# Patient Record
Sex: Female | Born: 1970
Health system: Southern US, Community
[De-identification: ages and names within clinical notes are randomized; demographics above are authoritative.]

## PROBLEM LIST (undated history)

## (undated) DIAGNOSIS — IMO0001 Reserved for inherently not codable concepts without codable children: Secondary | ICD-10-CM

## (undated) DIAGNOSIS — R87619 Unspecified abnormal cytological findings in specimens from cervix uteri: Secondary | ICD-10-CM

## (undated) DIAGNOSIS — J302 Other seasonal allergic rhinitis: Secondary | ICD-10-CM

## (undated) DIAGNOSIS — G43909 Migraine, unspecified, not intractable, without status migrainosus: Secondary | ICD-10-CM

## (undated) DIAGNOSIS — Z1509 Genetic susceptibility to other malignant neoplasm: Secondary | ICD-10-CM

## (undated) DIAGNOSIS — Z9889 Other specified postprocedural states: Secondary | ICD-10-CM

## (undated) DIAGNOSIS — Z9109 Other allergy status, other than to drugs and biological substances: Secondary | ICD-10-CM

## (undated) DIAGNOSIS — M8580 Other specified disorders of bone density and structure, unspecified site: Secondary | ICD-10-CM

## (undated) DIAGNOSIS — F988 Other specified behavioral and emotional disorders with onset usually occurring in childhood and adolescence: Secondary | ICD-10-CM

## (undated) DIAGNOSIS — Z1501 Genetic susceptibility to malignant neoplasm of breast: Secondary | ICD-10-CM

## (undated) DIAGNOSIS — J45909 Unspecified asthma, uncomplicated: Secondary | ICD-10-CM

## (undated) DIAGNOSIS — F329 Major depressive disorder, single episode, unspecified: Secondary | ICD-10-CM

## (undated) DIAGNOSIS — L709 Acne, unspecified: Secondary | ICD-10-CM

## (undated) DIAGNOSIS — IMO0002 Reserved for concepts with insufficient information to code with codable children: Secondary | ICD-10-CM

## (undated) DIAGNOSIS — T7840XA Allergy, unspecified, initial encounter: Secondary | ICD-10-CM

## (undated) DIAGNOSIS — R112 Nausea with vomiting, unspecified: Secondary | ICD-10-CM

## (undated) HISTORY — DX: Major depressive disorder, single episode, unspecified: F32.9

## (undated) HISTORY — DX: Genetic susceptibility to other malignant neoplasm: Z15.09

## (undated) HISTORY — PX: INTRAUTERINE DEVICE (IUD) INSERTION: SHX5877

## (undated) HISTORY — PX: NASAL SINUS SURGERY: SHX719

## (undated) HISTORY — DX: Allergy, unspecified, initial encounter: T78.40XA

## (undated) HISTORY — DX: Reserved for inherently not codable concepts without codable children: IMO0001

## (undated) HISTORY — DX: Migraine, unspecified, not intractable, without status migrainosus: G43.909

## (undated) HISTORY — DX: Unspecified abnormal cytological findings in specimens from cervix uteri: R87.619

## (undated) HISTORY — DX: Acne, unspecified: L70.9

## (undated) HISTORY — DX: Genetic susceptibility to malignant neoplasm of breast: Z15.01

## (undated) HISTORY — DX: Reserved for concepts with insufficient information to code with codable children: IMO0002

## (undated) HISTORY — DX: Other specified disorders of bone density and structure, unspecified site: M85.80

## (undated) HISTORY — PX: COSMETIC SURGERY: SHX468

---

## 1999-01-18 ENCOUNTER — Encounter (INDEPENDENT_AMBULATORY_CARE_PROVIDER_SITE_OTHER): Payer: Self-pay | Admitting: Specialist

## 1999-01-18 ENCOUNTER — Other Ambulatory Visit: Admission: RE | Admit: 1999-01-18 | Discharge: 1999-01-18 | Payer: Self-pay | Admitting: Obstetrics and Gynecology

## 2000-01-28 ENCOUNTER — Encounter: Payer: Self-pay | Admitting: Obstetrics and Gynecology

## 2000-01-28 ENCOUNTER — Ambulatory Visit (HOSPITAL_COMMUNITY): Admission: RE | Admit: 2000-01-28 | Discharge: 2000-01-28 | Payer: Self-pay | Admitting: Obstetrics and Gynecology

## 2000-02-05 ENCOUNTER — Other Ambulatory Visit: Admission: RE | Admit: 2000-02-05 | Discharge: 2000-02-05 | Payer: Self-pay | Admitting: Obstetrics and Gynecology

## 2000-06-30 ENCOUNTER — Encounter (INDEPENDENT_AMBULATORY_CARE_PROVIDER_SITE_OTHER): Payer: Self-pay | Admitting: Specialist

## 2000-06-30 ENCOUNTER — Inpatient Hospital Stay (HOSPITAL_COMMUNITY): Admission: AD | Admit: 2000-06-30 | Discharge: 2000-07-04 | Payer: Self-pay | Admitting: Obstetrics and Gynecology

## 2000-07-06 ENCOUNTER — Encounter: Admission: RE | Admit: 2000-07-06 | Discharge: 2000-07-31 | Payer: Self-pay | Admitting: Obstetrics and Gynecology

## 2000-07-22 HISTORY — PX: BREAST ENHANCEMENT SURGERY: SHX7

## 2001-01-19 HISTORY — PX: AUGMENTATION MAMMAPLASTY: SUR837

## 2001-09-02 ENCOUNTER — Other Ambulatory Visit: Admission: RE | Admit: 2001-09-02 | Discharge: 2001-09-02 | Payer: Self-pay | Admitting: Obstetrics and Gynecology

## 2002-09-13 ENCOUNTER — Other Ambulatory Visit: Admission: RE | Admit: 2002-09-13 | Discharge: 2002-09-13 | Payer: Self-pay | Admitting: *Deleted

## 2003-09-28 ENCOUNTER — Other Ambulatory Visit: Admission: RE | Admit: 2003-09-28 | Discharge: 2003-09-28 | Payer: Self-pay | Admitting: Obstetrics and Gynecology

## 2004-05-25 ENCOUNTER — Ambulatory Visit: Payer: Self-pay | Admitting: Family Medicine

## 2004-07-22 DIAGNOSIS — F32A Depression, unspecified: Secondary | ICD-10-CM

## 2004-07-22 DIAGNOSIS — M8580 Other specified disorders of bone density and structure, unspecified site: Secondary | ICD-10-CM

## 2004-07-22 HISTORY — DX: Depression, unspecified: F32.A

## 2004-07-22 HISTORY — DX: Other specified disorders of bone density and structure, unspecified site: M85.80

## 2004-08-29 ENCOUNTER — Other Ambulatory Visit: Admission: RE | Admit: 2004-08-29 | Discharge: 2004-08-29 | Payer: Self-pay | Admitting: Obstetrics and Gynecology

## 2005-11-01 ENCOUNTER — Other Ambulatory Visit: Admission: RE | Admit: 2005-11-01 | Discharge: 2005-11-01 | Payer: Self-pay | Admitting: Obstetrics and Gynecology

## 2005-11-18 ENCOUNTER — Ambulatory Visit: Payer: Self-pay | Admitting: Family Medicine

## 2006-06-16 ENCOUNTER — Ambulatory Visit: Payer: Self-pay | Admitting: Family Medicine

## 2006-07-30 ENCOUNTER — Emergency Department (HOSPITAL_COMMUNITY): Admission: EM | Admit: 2006-07-30 | Discharge: 2006-07-30 | Payer: Self-pay | Admitting: Emergency Medicine

## 2006-07-31 ENCOUNTER — Other Ambulatory Visit: Admission: RE | Admit: 2006-07-31 | Discharge: 2006-07-31 | Payer: Self-pay | Admitting: Obstetrics and Gynecology

## 2006-10-01 ENCOUNTER — Ambulatory Visit: Payer: Self-pay | Admitting: Family Medicine

## 2007-06-03 ENCOUNTER — Encounter (INDEPENDENT_AMBULATORY_CARE_PROVIDER_SITE_OTHER): Payer: Self-pay | Admitting: Internal Medicine

## 2007-08-06 ENCOUNTER — Other Ambulatory Visit: Admission: RE | Admit: 2007-08-06 | Discharge: 2007-08-06 | Payer: Self-pay | Admitting: Obstetrics & Gynecology

## 2008-02-23 ENCOUNTER — Encounter (INDEPENDENT_AMBULATORY_CARE_PROVIDER_SITE_OTHER): Payer: Self-pay | Admitting: Internal Medicine

## 2008-09-12 ENCOUNTER — Other Ambulatory Visit: Admission: RE | Admit: 2008-09-12 | Discharge: 2008-09-12 | Payer: Self-pay | Admitting: Obstetrics & Gynecology

## 2008-11-24 ENCOUNTER — Other Ambulatory Visit: Admission: RE | Admit: 2008-11-24 | Discharge: 2008-11-24 | Payer: Self-pay | Admitting: Obstetrics & Gynecology

## 2009-02-22 ENCOUNTER — Encounter (INDEPENDENT_AMBULATORY_CARE_PROVIDER_SITE_OTHER): Payer: Self-pay | Admitting: Internal Medicine

## 2010-12-07 NOTE — Op Note (Signed)
[facility]  Patient:    Susan West, Susan West                     MRN: [id number] Proc. Date: 06/30/00 Adm. Date:  [id number] Attending:  Madelyn Flavors                           Operative Report  PREOPERATIVE DIAGNOSES:       1. Intrauterine pregnancy at 33-1/2 weeks.                               2. Severe HELLP syndrome with nonreducible                                  cervix.  POSTOPERATIVE DIAGNOSES:      1. Intrauterine pregnancy at 33-1/2 weeks.                               2. Severe HELLP syndrome with nonreducible                                  cervix.  OPERATION:                    Primary low cervical transverse cesarean                               section.  SURGEON:                      Beather Arbour. Thomasena Edis, M.D., Ph.D.  ANESTHESIA:                   Spinal.  FLUIDS:                       2700 cc of crystalloid plus 1 g of cefotetan IV.  URINE OUTPUT:                 100 cc.  ESTIMATED BLOOD LOSS:         800 cc.  COMPLICATIONS:                None.  DESCRIPTION OF OPERATION:     The patient was brought to the operating room and identified on the operating room table.  After induction of adequate spinal anesthesia, the patient was placed in the left uterine displacement position, prepped and draped in the usual sterile fashion.  A Foley catheter was placed.  A Pfannenstiel incision was made and carried down to the fascia. The fascia was scored in the midline and extended bilateral using Mayo scissors.  It was then separated from the underlying muscles.  The muscles were separated in the midline down to the symphysis.  The peritoneum was entered sharply and carefully, taking care to avoid bowel or other abdominal contents. The peritoneal incision was extended superiorly then inferiorly down to the bladder edge.  A bladder blade was placed and a bladder flap was developed.  The uterus was scored in the lower uterine segment and  the incision was extended in a curvilinear fashion with bandage  scissors.  Great care was taken to avoid large sinuses on the left-most portion of the uterus. The fetus had been vertex in the MAU, but was noted to be almost transverse upon entry into the uterine cavity when membranes were ruptured.  The vertex was delivered without difficulty after it was rotated from the transverse to the vertex presentation.  The vertex was delivered onto the operative field without difficulty.  The baby was bulb suctioned and the remainder of the infants body was delivered onto the operative field.  The cord was doubly clamped and cut.  The baby was handed to the awaiting neonatal resuscitation team.  Cord pH was sent and was found to be 7.25.  Cord bloods were collected. The placenta was allowed to delivers spontaneously and sent to pathology for examination due to severe HELLP syndrome at 33-1/2 weeks.  The uterus was wiped clean of remaining products of conception and the superior aspect wrapped with a wet lap pack.  The bladder blade was placed.  Ring clamps were placed on the uterine incision.  The uterus was closed using a suture of 0 Monocryl in a simple running interlocking fashion.  An addition suture of 0 Monocryl was placed in an imbricating fashion.  There were noted to be two bleeding edges at the uterine incision.  Excellent hemostasis was achieved using figure-of-eight sutures of 0 Monocryl.  The uterus, tubes and ovaries appeared grossly normal.  The uterus was placed back into the abdominal cavity and the incision was irrigated copiously with warm lactated Ringers.  The uterine incision was again examined and examined numerous times and noted to be extremely hemostatic.  The bladder flap was then closed using simple running interlocking suture of 2-0 Monocryl.  Again, excellent hemostasis was noted at every layer.  Austina clamps were placed on the peritoneum and the peritoneum was closed  using a simple running suture of 3-0 Monocryl.  This was after the gutters were irrigated free of clots.  The subfascial tissue was irrigated and excellent hemostasis of the subfascial area was achieved using cautery.  The muscles were tacked together over the midline using interrupted sutures of 0 Monocryl.  After assuring excellent subfascial hemostasis, the fascia was closed using simple running suture of 0 PDS.  The left most angle of the incision was run to the right most angle of the incision and tied.  The subcutaneous tissue was irrigated copiously with warm lactated Ringers and excellent hemostasis was achieved using cautery.  The skin was closed with staples and Steri-Strips were applied.  The patient tolerated the procedure well without apparent complications.  She was transferred to the recovery room in stable condition after sponge, needle and instrument counts were correct. The baby was noted to be a 3 pound 13 ounce female with Apgars 8 and 8.  Cord pH was 7.25.  The baby went to the neonatal intensive care nursery.  The patient was taken to the recovery room, where she was placed on magnesium sulfate for seizure prophylaxis.  Laboratory studies will be followed very carefully. DD:  06/30/00 TD:  07/01/00 Job: [id number]

## 2010-12-07 NOTE — Discharge Summary (Signed)
[facility]  Patient:    Susan West, Susan West                     MRN: [id number] Adm. Date:  [id number] Disc. Date: [id number] Attending:  Madelyn Flavors Dictator:   Danie Chandler, R.N.                           Discharge Summary  ADMITTING DIAGNOSES:          1. Intrauterine pregnancy at 37 1/[redacted] weeks                                  gestation.                               2. Severe HELLP syndrome with nonreducible                                  cervix.  DISCHARGE DIAGNOSES:          1. Intrauterine pregnancy at 83 1/[redacted] weeks                                  gestation.                               2. Severe HELLP syndrome with nonreducible                                  cervix.  PROCEDURE:                    On June 30, 2000 primary low cervical transverse cesarean section.  REASON FOR ADMISSION:         The patient is a 40 year old gravida 3, para 1 married white female at 46 1/[redacted] weeks gestation who came to the maternity admissions unit complaining of contractions.  She received two doses of subcu terbutaline without effect.  At the time of admission she was complaining of some slight nausea.  The decision was made to check a PIH profile.  PIH revealed 30 mg of protein, creatinine 1.8, platelets 174,000, LDH 1117, SGOT 238, SGPT 313, uric acid 9.0, hemoglobin 12.0.  Laboratories were rechecked for accuracy and these results were accurate.  The patient had felt bad for three days.  The decision was made to proceed with primary cesarean section due to severe elevation of LFTs consistent with HELLP syndrome.  HOSPITAL COURSE:              The patient was taken to the operating room and underwent the above named procedure without complication.  This was productive of a viable female infant with Apgars of 8 at one minute and 8 at five minutes and an arterial cord pH of 7.25.  Postoperatively the patient was on magnesium sulfate.  On  postoperative day #1 her O2 saturations were 95-98%.  Her urine output was 100-400 cc/hour.  Her deep tendon reflexes were within normal limits.  She had trace edema.  Her PT was 13.9, PTT 30,  fibrinogen 292, creatinine 1.7, uric acid 8.4, LDH 430, SGOT 226, SGPT 272.  The patient was continued on magnesium sulfate and on postoperative day #2 the patients O2 saturations were 99%.  Urine output was 150-175 cc/hour.  Fibrinogen was 321, PTT 42, PT 13.7, creatinine 1.8, SGOT 107, SGPT 185, hemoglobin 10.7, platelets 188,000.  The patient was in stable condition and was transferred to a regular room.  On postoperative day #3 the patient was without complaint. Deep tendon reflexes were 1+ with no clonus.  She was discharged home on postoperative day #4.  She was without PIH signs or symptoms.  Deep tendon reflexes were 2+ with no clonus.  She had 2+ edema.  Her LDH was 192, SGOT 46, SGPT 111, platelets were within normal limits.  CONDITION ON DISCHARGE:       Stable.  DIET:                         Regular, as tolerated.  ACTIVITY:                     No heavy lifting, driving, vaginal entry.  FOLLOW-UP:                    She is to follow up for laboratory work in the morning and on Monday in the office.  She is to call for temperature greater than 100 degrees, persistent nausea or vomiting, heavy vaginal bleeding, and/or redness or drainage from the incision site as well as any PIH signs or symptoms.  DISCHARGE MEDICATIONS:        1. Prenatal vitamin one p.o. q.d.                               2. Tylox as directed by M.D.                               3. Keflex as directed by M.D. for persistent                                  sinus drainage consistent with sinusitis. DD:  07/04/00 TD:  07/04/00 Job: [id number]

## 2011-07-23 HISTORY — PX: OOPHORECTOMY: SHX86

## 2011-10-21 ENCOUNTER — Other Ambulatory Visit: Payer: Self-pay | Admitting: Allergy and Immunology

## 2011-10-21 DIAGNOSIS — J329 Chronic sinusitis, unspecified: Secondary | ICD-10-CM

## 2011-10-25 ENCOUNTER — Other Ambulatory Visit: Payer: Self-pay

## 2011-11-01 ENCOUNTER — Ambulatory Visit
Admission: RE | Admit: 2011-11-01 | Discharge: 2011-11-01 | Disposition: A | Discharge status: Home / Self Care | Payer: BC Managed Care – PPO | Source: Ambulatory Visit | Attending: Allergy and Immunology | Admitting: Allergy and Immunology

## 2011-11-01 DIAGNOSIS — J329 Chronic sinusitis, unspecified: Secondary | ICD-10-CM

## 2012-03-11 ENCOUNTER — Other Ambulatory Visit: Payer: Self-pay | Admitting: Obstetrics & Gynecology

## 2012-03-11 DIAGNOSIS — Z1231 Encounter for screening mammogram for malignant neoplasm of breast: Secondary | ICD-10-CM

## 2012-03-31 ENCOUNTER — Encounter: Payer: Self-pay | Admitting: Gynecologic Oncology

## 2012-04-03 ENCOUNTER — Ambulatory Visit: Payer: BC Managed Care – PPO | Attending: Gynecology | Admitting: Gynecology

## 2012-04-03 ENCOUNTER — Encounter: Payer: Self-pay | Admitting: Gynecology

## 2012-04-03 VITALS — BP 100/58 | HR 70 | Temp 98.4°F | Resp 20 | Ht 62.64 in | Wt 120.9 lb

## 2012-04-03 DIAGNOSIS — N9489 Other specified conditions associated with female genital organs and menstrual cycle: Secondary | ICD-10-CM | POA: Insufficient documentation

## 2012-04-03 DIAGNOSIS — N83209 Unspecified ovarian cyst, unspecified side: Secondary | ICD-10-CM

## 2012-04-03 DIAGNOSIS — R1909 Other intra-abdominal and pelvic swelling, mass and lump: Secondary | ICD-10-CM | POA: Insufficient documentation

## 2012-04-03 DIAGNOSIS — R1900 Intra-abdominal and pelvic swelling, mass and lump, unspecified site: Secondary | ICD-10-CM

## 2012-04-03 NOTE — Patient Instructions (Signed)
We will contact Dr. Rondel Baton office to coordinate surgery.

## 2012-04-03 NOTE — Progress Notes (Signed)
Consult Note: Gyn-Onc   Gayathri N Vandam 41 y.o. female  Chief Complaint  Patient presents with  . Pelvic mass    New Consult        HPI: 41-year-old white female seen in consultation request of Dr. Suzanne Miller regarding a newly diagnosed complex pelvic mass. Patient indicates that she saw Dr. Miller for evaluation of her IUD which needed to be changed. In the course of evaluation the mass was found. Ultrasound describes the mass is a right adnexal mass measuring 12 x 9 x 10.2 x 10.8 cm. There cystic areas as well as solid areas with calcifications. Arterial blood flow was seen within the mass. The left ovary measures 4 x 2.7 x 2.7 cm. Uterus appears normal. Patient does have some liver cysts. CA 125 is 14.9 units per mL.  The patient reports some vague abdominal pressure and slight change in bowel habits.  She has a past history of a cesarean section but no other abdominal surgery. She's currently using a Mirena IUD.  Review of Systems:10 point review of systems is negative as noted above.   Vitals: Blood pressure 100/58, pulse 70, temperature 98.4 F (36.9 C), temperature source Oral, resp. rate 20, height 5' 2.64" (1.591 m), weight 120 lb 14.4 oz (54.84 kg).  Physical Exam: General : The patient is a healthy woman in no acute distress.  HEENT: normocephalic, extraoccular movements normal; neck is supple without thyromegally  Lynphnodes: Supraclavicular and inguinal nodes not enlarged  Abdomen: Soft, non-tender, no ascites, no organomegally, no masses, no hernias  Pelvic:  EGBUS: Normal female  Vagina: Normal, no lesions  Urethra and Bladder: Normal, non-tender  Cervix: Normal Uterus: Normal size and pushed posteriorly by a cystic mass in the anterior pelvis measuring approximately 12 cm in diameter. There is no cul-de-sac nodularity in the mass is mobile.   Rectal: normal sphincter tone, no masses, no blood  Lower extremities: No edema or varicosities. Normal range of motion     Assessment/Plan: 41-year-old complex pelvic mass. There are some characteristics that suggest possibility of malignancy (such as increased blood flow and solid areas). CA 125 value of 14 is somewhat reassuring.  Nonetheless, recommend the patient undergo resection of the mass through her prior Pfannenstiel incision obtaining intraoperative frozen section to guide further surgery. The patient would like to have a gynecologic oncologist participating in the surgery should this be cancer (again had indicated the patient and her mother I believe this is most likely benign). 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  We will contact Dr. Miller's office to coordinate surgical scheduling. Allergies  Allergen Reactions  . Penicillins Nausea Only    Extreme stomach pain    Past Medical History  Diagnosis Date  . Pelvic mass in female   . IUD   . Headache, migraine     Past Surgical History  Procedure Date  . Nasal sinus surgery     1999  . Cesarean section 06/2000  . Breast enhancement surgery 2002    Current Outpatient Prescriptions  Medication Sig Dispense Refill  . BIOTIN PO Take 1 capsule by mouth.      . Coenzyme Q10 (CO Q 10 PO) Take 1 capsule by mouth.      . fexofenadine-pseudoephedrine (ALLEGRA-D 24) 180-240 MG per 24 hr tablet Take 1   tablet by mouth daily. 1/2 tablet daily      . Omega-3 Fatty Acids (OMEGA-3 FISH OIL PO) Take 1 tablet by mouth.      . VITAMIN E PO Take 1 capsule by mouth.        History   Social History  . Marital Status: Married    Spouse Name: N/A    Number of Children: N/A  . Years of Education: N/A   Occupational History  . Not on file.   Social History Main Topics  . Smoking status: Never Smoker   . Smokeless tobacco: Not on file  . Alcohol Use: Yes     rare  . Drug Use: No   . Sexually Active: Yes   Other Topics Concern  . Not on file   Social History Narrative  . No narrative on file    Family History  Problem Relation Age of Onset  . Cancer Father     prostate      CLARKE-PEARSON,Doha Boling L, MD 04/03/2012, 11:23 AM         

## 2012-04-06 ENCOUNTER — Encounter: Payer: Self-pay | Admitting: Gynecology

## 2012-04-17 ENCOUNTER — Ambulatory Visit (HOSPITAL_COMMUNITY)
Admission: RE | Admit: 2012-04-17 | Discharge: 2012-04-17 | Disposition: A | Discharge status: Home / Self Care | Payer: BC Managed Care – PPO | Source: Ambulatory Visit | Attending: Obstetrics & Gynecology | Admitting: Obstetrics & Gynecology

## 2012-04-17 ENCOUNTER — Encounter (HOSPITAL_COMMUNITY)
Admission: RE | Admit: 2012-04-17 | Discharge: 2012-04-17 | Disposition: A | Discharge status: Home / Self Care | Payer: BC Managed Care – PPO | Source: Ambulatory Visit | Attending: Obstetrics & Gynecology | Admitting: Obstetrics & Gynecology

## 2012-04-17 ENCOUNTER — Encounter (HOSPITAL_COMMUNITY): Payer: Self-pay

## 2012-04-17 ENCOUNTER — Encounter (HOSPITAL_COMMUNITY): Payer: Self-pay | Admitting: Pharmacy Technician

## 2012-04-17 DIAGNOSIS — Z01812 Encounter for preprocedural laboratory examination: Secondary | ICD-10-CM | POA: Insufficient documentation

## 2012-04-17 DIAGNOSIS — J45909 Unspecified asthma, uncomplicated: Secondary | ICD-10-CM | POA: Insufficient documentation

## 2012-04-17 DIAGNOSIS — Z01818 Encounter for other preprocedural examination: Secondary | ICD-10-CM | POA: Insufficient documentation

## 2012-04-17 DIAGNOSIS — D4959 Neoplasm of unspecified behavior of other genitourinary organ: Secondary | ICD-10-CM | POA: Insufficient documentation

## 2012-04-17 HISTORY — DX: Unspecified asthma, uncomplicated: J45.909

## 2012-04-17 HISTORY — DX: Other allergy status, other than to drugs and biological substances: Z91.09

## 2012-04-17 HISTORY — DX: Other specified postprocedural states: Z98.890

## 2012-04-17 HISTORY — DX: Other specified behavioral and emotional disorders with onset usually occurring in childhood and adolescence: F98.8

## 2012-04-17 HISTORY — DX: Other specified postprocedural states: R11.2

## 2012-04-17 LAB — COMPREHENSIVE METABOLIC PANEL
ALT: 14 U/L (ref 0–35)
CO2: 28 mEq/L (ref 19–32)
Calcium: 9.4 mg/dL (ref 8.4–10.5)
Creatinine, Ser: 0.86 mg/dL (ref 0.50–1.10)
GFR calc Af Amer: >90 mL/min (ref >90)
GFR calc non Af Amer: 82 mL/min — ABNORMAL LOW (ref >90)
Glucose, Bld: 80 mg/dL (ref 70–99)
Sodium: 137 mEq/L (ref 135–145)
Total Protein: 6.9 g/dL (ref 6.0–8.3)

## 2012-04-17 LAB — CBC WITH DIFFERENTIAL/PLATELET
Basophils Absolute: 0 10*3/uL (ref 0.0–0.1)
Eosinophils Absolute: 0.1 10*3/uL (ref 0.0–0.7)
Eosinophils Relative: 3 % (ref 0–5)
HCT: 38.8 % (ref 36.0–46.0)
Lymphocytes Relative: 39 % (ref 12–46)
Lymphs Abs: 2.1 10*3/uL (ref 0.7–4.0)
MCH: 31.7 pg (ref 26.0–34.0)
MCV: 92.4 fL (ref 78.0–100.0)
Monocytes Absolute: 0.5 10*3/uL (ref 0.1–1.0)
Platelets: 250 10*3/uL (ref 150–400)
RDW: 11.6 % (ref 11.5–15.5)

## 2012-04-17 NOTE — Patient Instructions (Addendum)
                   Susan West  04/17/2012   YOUR SURGERY IS SCHEDULED FOR 04/21/12 AT 11:00 AM               ARRIVE TO SHORT STAY CENTER AT 8:30 AM              IF ANY PROBLEMS THE DAY OF SURGERY PLEASE CALL [phone]       Remember:   Do not eat food or drink liquids AFTER MIDNIGHT   Take these medicines the morning of surgery with A SIP OF WATER: DOXYCYCLINE / ALLEGRA / FLONASE NASAL SPRAY   Do not wear jewelry, make-up   Do not wear lotions, powders, or perfumes.   Do not shave legs or underarms 12 hrs. before surgery (men may shave face)  Do not bring valuables to the hospital.  Contacts, dentures or bridgework may not be worn into surgery.  Leave suitcase in the car. After surgery it may be brought to your room.  For patients admitted to the hospital more than one night, checkout time is 11:00                         AM the day of discharge.   Patients discharged the day of surgery will not be allowed to drive home                             If going home same day of surgery, must have someone stay with you first                           4 hrs at home and arrange for some one to drive you home from hospital.    Special Instructions:   Please read over the following fact sheets that you were given:               1. MRSA  INFORMATION                      2. [place] PREPARING FOR SURGERY SHEET                                                    X_____________________________________________________________________

## 2012-04-21 ENCOUNTER — Inpatient Hospital Stay (HOSPITAL_COMMUNITY)
Admission: RE | Admit: 2012-04-21 | Discharge: 2012-04-23 | DRG: 359 | Disposition: A | Discharge status: Home / Self Care | Payer: BC Managed Care – PPO | Source: Ambulatory Visit | Attending: Obstetrics & Gynecology | Admitting: Obstetrics & Gynecology

## 2012-04-21 ENCOUNTER — Ambulatory Visit (HOSPITAL_COMMUNITY): Payer: BC Managed Care – PPO | Admitting: Anesthesiology

## 2012-04-21 ENCOUNTER — Encounter (HOSPITAL_COMMUNITY): Payer: Self-pay | Admitting: Anesthesiology

## 2012-04-21 ENCOUNTER — Encounter (HOSPITAL_COMMUNITY): Payer: Self-pay | Admitting: *Deleted

## 2012-04-21 ENCOUNTER — Encounter (HOSPITAL_COMMUNITY)
Admission: RE | Disposition: A | Discharge status: Home / Self Care | Payer: Self-pay | Source: Ambulatory Visit | Attending: Obstetrics & Gynecology

## 2012-04-21 DIAGNOSIS — Z975 Presence of (intrauterine) contraceptive device: Secondary | ICD-10-CM

## 2012-04-21 DIAGNOSIS — D279 Benign neoplasm of unspecified ovary: Principal | ICD-10-CM | POA: Diagnosis present

## 2012-04-21 DIAGNOSIS — N83209 Unspecified ovarian cyst, unspecified side: Secondary | ICD-10-CM | POA: Diagnosis present

## 2012-04-21 DIAGNOSIS — J45909 Unspecified asthma, uncomplicated: Secondary | ICD-10-CM | POA: Diagnosis present

## 2012-04-21 DIAGNOSIS — Z23 Encounter for immunization: Secondary | ICD-10-CM

## 2012-04-21 HISTORY — PX: LAPAROTOMY: SHX154

## 2012-04-21 HISTORY — PX: SALPINGOOPHORECTOMY: SHX82

## 2012-04-21 LAB — HEMOGLOBIN AND HEMATOCRIT, BLOOD: HCT: 32.7 % — ABNORMAL LOW (ref 36.0–46.0)

## 2012-04-21 SURGERY — LAPAROTOMY, EXPLORATORY
Anesthesia: General | Laterality: Right | Wound class: Clean

## 2012-04-21 MED ORDER — ONDANSETRON HCL 4 MG/2ML IJ SOLN
INTRAMUSCULAR | Status: DC | PRN
  Administered 2012-04-21: 4 mg via INTRAVENOUS

## 2012-04-21 MED ORDER — GLYCOPYRROLATE 0.2 MG/ML IJ SOLN
INTRAMUSCULAR | Status: DC | PRN
  Administered 2012-04-21: .4 mg via INTRAVENOUS

## 2012-04-21 MED ORDER — METRONIDAZOLE IN NACL 5-0.79 MG/ML-% IV SOLN
500.0000 mg | INTRAVENOUS | Status: AC
  Administered 2012-04-21: 500 mg via INTRAVENOUS

## 2012-04-21 MED ORDER — HYDROMORPHONE 0.3 MG/ML IV SOLN
INTRAVENOUS | Status: DC
  Administered 2012-04-21 – 2012-04-22 (×4): 0.3 mg via INTRAVENOUS

## 2012-04-21 MED ORDER — ONDANSETRON HCL 4 MG PO TABS: 4.0000 mg | ORAL_TABLET | Freq: Four times a day (QID) | ORAL | Status: DC | PRN

## 2012-04-21 MED ORDER — SODIUM CHLORIDE 0.9 % IV BOLUS (SEPSIS)
500.0000 mL | Freq: Once | INTRAVENOUS | Status: AC
  Administered 2012-04-21: 14:00:00 via INTRAVENOUS

## 2012-04-21 MED ORDER — KCL IN DEXTROSE-NACL 20-5-0.45 MEQ/L-%-% IV SOLN
INTRAVENOUS | Status: AC
  Filled 2012-04-21: qty 1000

## 2012-04-21 MED ORDER — LIDOCAINE HCL (CARDIAC) 20 MG/ML IV SOLN
INTRAVENOUS | Status: DC | PRN
  Administered 2012-04-21: 50 mg via INTRAVENOUS

## 2012-04-21 MED ORDER — PROPOFOL 10 MG/ML IV BOLUS
INTRAVENOUS | Status: DC | PRN
  Administered 2012-04-21: 90 mg via INTRAVENOUS

## 2012-04-21 MED ORDER — KCL IN DEXTROSE-NACL 20-5-0.45 MEQ/L-%-% IV SOLN
INTRAVENOUS | Status: DC
  Administered 2012-04-21: 15:00:00 via INTRAVENOUS
  Administered 2012-04-21: 1000 mL via INTRAVENOUS
  Filled 2012-04-21 (×3): qty 1000

## 2012-04-21 MED ORDER — NALOXONE HCL 0.4 MG/ML IJ SOLN: 0.4000 mg | INTRAMUSCULAR | Status: DC | PRN

## 2012-04-21 MED ORDER — ONDANSETRON HCL 4 MG/2ML IJ SOLN: 4.0000 mg | Freq: Four times a day (QID) | INTRAMUSCULAR | Status: DC | PRN

## 2012-04-21 MED ORDER — DIPHENHYDRAMINE HCL 12.5 MG/5ML PO ELIX: 12.5000 mg | ORAL_SOLUTION | Freq: Four times a day (QID) | ORAL | Status: DC | PRN

## 2012-04-21 MED ORDER — METRONIDAZOLE IN NACL 5-0.79 MG/ML-% IV SOLN
INTRAVENOUS | Status: AC
  Filled 2012-04-21: qty 100

## 2012-04-21 MED ORDER — FENTANYL CITRATE 0.05 MG/ML IJ SOLN
INTRAMUSCULAR | Status: DC | PRN
  Administered 2012-04-21 (×2): 50 ug via INTRAVENOUS
  Administered 2012-04-21: 100 ug via INTRAVENOUS

## 2012-04-21 MED ORDER — DIPHENHYDRAMINE HCL 50 MG/ML IJ SOLN: 12.5000 mg | Freq: Four times a day (QID) | INTRAMUSCULAR | Status: DC | PRN

## 2012-04-21 MED ORDER — HYDROMORPHONE HCL PF 1 MG/ML IJ SOLN
0.2500 mg | INTRAMUSCULAR | Status: DC | PRN
  Administered 2012-04-21 (×2): 0.5 mg via INTRAVENOUS

## 2012-04-21 MED ORDER — ZOLPIDEM TARTRATE 5 MG PO TABS: 5.0000 mg | ORAL_TABLET | Freq: Every evening | ORAL | Status: DC | PRN

## 2012-04-21 MED ORDER — LACTATED RINGERS IV SOLN: INTRAVENOUS | Status: DC

## 2012-04-21 MED ORDER — LACTATED RINGERS IV SOLN
INTRAVENOUS | Status: DC
  Administered 2012-04-21: 1000 mL via INTRAVENOUS

## 2012-04-21 MED ORDER — MAGNESIUM HYDROXIDE 400 MG/5ML PO SUSP
30.0000 mL | Freq: Three times a day (TID) | ORAL | Status: AC
  Administered 2012-04-22: 30 mL via ORAL
  Filled 2012-04-21 (×3): qty 30

## 2012-04-21 MED ORDER — 0.9 % SODIUM CHLORIDE (POUR BTL) OPTIME
TOPICAL | Status: DC | PRN
  Administered 2012-04-21: 1000 mL

## 2012-04-21 MED ORDER — CIPROFLOXACIN IN D5W 400 MG/200ML IV SOLN
400.0000 mg | INTRAVENOUS | Status: AC
  Administered 2012-04-21: 400 mg via INTRAVENOUS

## 2012-04-21 MED ORDER — LIP MEDEX EX OINT: TOPICAL_OINTMENT | CUTANEOUS | Status: DC | PRN

## 2012-04-21 MED ORDER — NEOSTIGMINE METHYLSULFATE 1 MG/ML IJ SOLN
INTRAMUSCULAR | Status: DC | PRN
  Administered 2012-04-21: 3 mg via INTRAVENOUS

## 2012-04-21 MED ORDER — STERILE WATER FOR IRRIGATION IR SOLN
Status: DC | PRN
  Administered 2012-04-21: 1500 mL

## 2012-04-21 MED ORDER — KCL IN DEXTROSE-NACL 20-5-0.45 MEQ/L-%-% IV SOLN
INTRAVENOUS | Status: DC
  Administered 2012-04-21 – 2012-04-22 (×2): via INTRAVENOUS
  Filled 2012-04-21 (×5): qty 1000

## 2012-04-21 MED ORDER — BUPIVACAINE LIPOSOME 1.3 % IJ SUSP
20.0000 mL | INTRAMUSCULAR | Status: DC
  Filled 2012-04-21: qty 20

## 2012-04-21 MED ORDER — ACETAMINOPHEN 10 MG/ML IV SOLN
INTRAVENOUS | Status: DC | PRN
  Administered 2012-04-21: 1000 mg via INTRAVENOUS

## 2012-04-21 MED ORDER — MIDAZOLAM HCL 5 MG/5ML IJ SOLN
INTRAMUSCULAR | Status: DC | PRN
  Administered 2012-04-21: 2 mg via INTRAVENOUS

## 2012-04-21 MED ORDER — ACETAMINOPHEN 10 MG/ML IV SOLN
INTRAVENOUS | Status: AC
  Filled 2012-04-21: qty 100

## 2012-04-21 MED ORDER — LIP MEDEX EX OINT
TOPICAL_OINTMENT | CUTANEOUS | Status: AC
  Administered 2012-04-21: 23:00:00
  Filled 2012-04-21: qty 7

## 2012-04-21 MED ORDER — ONDANSETRON HCL 4 MG/2ML IJ SOLN
4.0000 mg | Freq: Four times a day (QID) | INTRAMUSCULAR | Status: DC | PRN
  Administered 2012-04-21 (×2): 4 mg via INTRAVENOUS
  Filled 2012-04-21 (×2): qty 2

## 2012-04-21 MED ORDER — SODIUM CHLORIDE 0.9 % IJ SOLN: 9.0000 mL | INTRAMUSCULAR | Status: DC | PRN

## 2012-04-21 MED ORDER — OXYCODONE-ACETAMINOPHEN 5-325 MG PO TABS
1.0000 | ORAL_TABLET | ORAL | Status: DC | PRN
  Administered 2012-04-23: 1 via ORAL
  Filled 2012-04-21: qty 1

## 2012-04-21 MED ORDER — LORATADINE 10 MG PO TABS
10.0000 mg | ORAL_TABLET | Freq: Every day | ORAL | Status: DC
  Filled 2012-04-21 (×3): qty 1

## 2012-04-21 MED ORDER — HYDROMORPHONE 0.3 MG/ML IV SOLN
INTRAVENOUS | Status: AC
  Filled 2012-04-21: qty 25

## 2012-04-21 MED ORDER — ROCURONIUM BROMIDE 100 MG/10ML IV SOLN
INTRAVENOUS | Status: DC | PRN
  Administered 2012-04-21: 30 mg via INTRAVENOUS

## 2012-04-21 MED ORDER — KETOROLAC TROMETHAMINE 30 MG/ML IJ SOLN
30.0000 mg | Freq: Four times a day (QID) | INTRAMUSCULAR | Status: AC
  Administered 2012-04-21 (×2): 30 mg via INTRAVENOUS
  Filled 2012-04-21 (×2): qty 1

## 2012-04-21 MED ORDER — BUPIVACAINE LIPOSOME 1.3 % IJ SUSP
INTRAMUSCULAR | Status: DC | PRN
  Administered 2012-04-21: 20 mL

## 2012-04-21 MED ORDER — DEXAMETHASONE SODIUM PHOSPHATE 10 MG/ML IJ SOLN
INTRAMUSCULAR | Status: DC | PRN
  Administered 2012-04-21: 10 mg via INTRAVENOUS

## 2012-04-21 MED ORDER — FLUTICASONE PROPIONATE 50 MCG/ACT NA SUSP
1.0000 | Freq: Every day | NASAL | Status: DC
  Administered 2012-04-23: 1 via NASAL
  Filled 2012-04-21: qty 16

## 2012-04-21 MED ORDER — KETOROLAC TROMETHAMINE 30 MG/ML IJ SOLN: 30.0000 mg | Freq: Four times a day (QID) | INTRAMUSCULAR | Status: AC

## 2012-04-21 MED ORDER — HYDROMORPHONE HCL PF 1 MG/ML IJ SOLN
INTRAMUSCULAR | Status: AC
  Filled 2012-04-21: qty 1

## 2012-04-21 MED ORDER — EPHEDRINE SULFATE 50 MG/ML IJ SOLN
INTRAMUSCULAR | Status: DC | PRN
  Administered 2012-04-21 (×2): 5 mg via INTRAVENOUS

## 2012-04-21 MED ORDER — CIPROFLOXACIN IN D5W 400 MG/200ML IV SOLN
INTRAVENOUS | Status: AC
  Filled 2012-04-21: qty 200

## 2012-04-21 SURGICAL SUPPLY — 53 items
APL SKNCLS STERI-STRIP NONHPOA (GAUZE/BANDAGES/DRESSINGS) ×2
APPLIER CLIP 11 MED OPEN (CLIP) ×3
APR CLP MED 11 20 MLT OPN (CLIP) ×2
ATTRACTOMAT 16X20 MAGNETIC DRP (DRAPES) ×3 IMPLANT
BAG URINE DRAINAGE (UROLOGICAL SUPPLIES) ×2 IMPLANT
BENZOIN TINCTURE PRP APPL 2/3 (GAUZE/BANDAGES/DRESSINGS) ×3 IMPLANT
BLADE EXTENDED COATED 6.5IN (ELECTRODE) ×3 IMPLANT
CANISTER SUCTION 2500CC (MISCELLANEOUS) ×3 IMPLANT
CHLORAPREP W/TINT 26ML (MISCELLANEOUS) ×3 IMPLANT
CLIP APPLIE 11 MED OPEN (CLIP) ×2 IMPLANT
CLIP TI MEDIUM LARGE 6 (CLIP) ×12 IMPLANT
CLOTH BEACON ORANGE TIMEOUT ST (SAFETY) ×3 IMPLANT
CONT SPECI 4OZ STER CLIK (MISCELLANEOUS) ×3 IMPLANT
COVER SURGICAL LIGHT HANDLE (MISCELLANEOUS) ×6 IMPLANT
DRAPE UTILITY 15X26 (DRAPE) ×3 IMPLANT
DRAPE WARM FLUID 44X44 (DRAPE) ×3 IMPLANT
DRSG TELFA 4X14 ISLAND ADH (GAUZE/BANDAGES/DRESSINGS) ×1 IMPLANT
ELECT REM PT RETURN 9FT ADLT (ELECTROSURGICAL) ×3
ELECTRODE REM PT RTRN 9FT ADLT (ELECTROSURGICAL) ×2 IMPLANT
GAUZE SPONGE 4X4 16PLY XRAY LF (GAUZE/BANDAGES/DRESSINGS) ×2 IMPLANT
GLOVE BIO SURGEON STRL SZ7.5 (GLOVE) ×15 IMPLANT
GLOVE BIOGEL M STRL SZ7.5 (GLOVE) ×15 IMPLANT
GLOVE INDICATOR 8.0 STRL GRN (GLOVE) ×3 IMPLANT
GOWN STRL REIN XL XLG (GOWN DISPOSABLE) ×3 IMPLANT
KIT BASIN OR (CUSTOM PROCEDURE TRAY) ×3 IMPLANT
LIGASURE IMPACT 36 18CM CVD LR (INSTRUMENTS) ×2 IMPLANT
NS IRRIG 1000ML POUR BTL (IV SOLUTION) ×8 IMPLANT
PACK ABDOMINAL WL (CUSTOM PROCEDURE TRAY) ×3 IMPLANT
SHEET LAVH (DRAPES) ×3 IMPLANT
SPONGE LAP 18X18 X RAY DECT (DISPOSABLE) ×4 IMPLANT
STRIP CLOSURE SKIN 1/2X4 (GAUZE/BANDAGES/DRESSINGS) ×3 IMPLANT
SUT ETHILON 1 LR 30 (SUTURE) IMPLANT
SUT PDS AB 0 CT1 36 (SUTURE) ×4 IMPLANT
SUT PDS AB 0 CTX 60 (SUTURE) ×4 IMPLANT
SUT SILK 0 (SUTURE)
SUT SILK 0 30XBRD TIE 6 (SUTURE) ×2 IMPLANT
SUT SILK 2 0 (SUTURE)
SUT SILK 2 0 30  PSL (SUTURE)
SUT SILK 2 0 30 PSL (SUTURE) IMPLANT
SUT SILK 2-0 18XBRD TIE 12 (SUTURE) ×2 IMPLANT
SUT VIC AB 0 CT1 36 (SUTURE) ×21 IMPLANT
SUT VIC AB 2-0 CT2 27 (SUTURE) IMPLANT
SUT VIC AB 2-0 SH 27 (SUTURE) ×3
SUT VIC AB 2-0 SH 27X BRD (SUTURE) ×4 IMPLANT
SUT VIC AB 3-0 CTX 36 (SUTURE) IMPLANT
SUT VIC AB 4-0 PS1 27 (SUTURE) ×6 IMPLANT
SUT VIC AB 4-0 PS2 27 (SUTURE) ×2 IMPLANT
SUT VICRYL 0 TIES 12 18 (SUTURE) ×3 IMPLANT
TOWEL BLUE STERILE X RAY DET (MISCELLANEOUS) ×3 IMPLANT
TOWEL OR 17X26 10 PK STRL BLUE (TOWEL DISPOSABLE) ×3 IMPLANT
TOWEL OR NON WOVEN STRL DISP B (DISPOSABLE) ×3 IMPLANT
TRAY FOLEY CATH 14FRSI W/METER (CATHETERS) ×3 IMPLANT
WATER STERILE IRR 1500ML POUR (IV SOLUTION) ×3 IMPLANT

## 2012-04-21 NOTE — Anesthesia Postprocedure Evaluation (Signed)
  Anesthesia Post-op Note  Patient: Susan West  Procedure(s) Performed: Procedure(s) (LRB): EXPLORATORY LAPAROTOMY (N/A) SALPINGO OOPHERECTOMY (Right)  Patient Location: PACU  Anesthesia Type: General  Level of Consciousness: awake and alert   Airway and Oxygen Therapy: Patient Spontanous Breathing  Post-op Pain: mild  Post-op Assessment: Post-op Vital signs reviewed, Patient's Cardiovascular Status Stable, Respiratory Function Stable, Patent Airway and No signs of Nausea or vomiting  Post-op Vital Signs: stable  Complications: No apparent anesthesia complications

## 2012-04-21 NOTE — Progress Notes (Signed)
Telford Nab, RN in to see pt and Warner Mccreedy, NP in to see pt; BP 84/48, additional bolus begun, abd soft, no bleeding seen; rapid response nurse signing off

## 2012-04-21 NOTE — Preoperative (Signed)
Beta Blockers   Reason not to administer Beta Blockers:Not Applicable 

## 2012-04-21 NOTE — Progress Notes (Signed)
Report to [place], RN room [phone]

## 2012-04-21 NOTE — Interval H&P Note (Signed)
History and Physical Interval Note:  04/21/2012 10:04 AM  Susan West  has presented today for surgery, with the diagnosis of pelvic mass  The various methods of treatment have been discussed with the patient and family. After consideration of risks, benefits and other options for treatment, the patient has consented to  Procedure(s) (LRB) with comments: EXPLORATORY LAPAROTOMY (N/A) SALPINGO OOPHERECTOMY (Right) HYSTERECTOMY ABDOMINAL (Right) POSSIBLE OMENTECTOMY  LSO HYSTERECTOMY AND OTHER STAGING PROCEDURES as a surgical intervention .  The patient's history has been reviewed, patient examined, no change in status, stable for surgery.  I have reviewed the patient's chart and labs.  Questions were answered to the patient's satisfaction.     [place], [facility]

## 2012-04-21 NOTE — Op Note (Signed)
Pre-operative Diagnosis: Right adnexal mass  Post-operative Diagnosis: Right mature  ovarian teratoma  Surgeon:  Maryclare Labrador. Alexys Lobello MD., PhD  Assistant Surgeon: Antionette Char M.D.  Assistant:  Telford Nab RN MSN  Operation: Right salpingo-oophorectomy  Findings:  15 cm right adnexal mass that was not adherent to any structures. Normal appearing uterus appendix left ovary and left fallopian tube. Palpably normal liver gallbladder and kidneys.  Procedure Details  The patient was seen in the Holding Room. The risks, benefits, complications, treatment options, and expected outcomes were discussed with the patient.  The patient concurred with the proposed plan, giving informed consent.   The patient was  identified as Susan West and the procedure verified as unilateral salpingo-oophorectomy.   A Time Out was held and the above information confirmed.  After induction of anesthesia, the patient was draped and prepped in the usual sterile manner. The patient was placed in the semi-lithotomy position, in [place] stirrups after anesthesia and draped and prepped in the usual sterile manner. A foley catheter was placed.  A Pfannenstiel incision was made and carried through the subcutaneous tissue to the fascia. The fascial incision was made and extended. The rectus muscles were separated. The peritoneum was identified and entered. Peritoneal incision was extended longitudinally.  Pelvic washings were collected in the right adnexal mass was delivered into the operative field. Torsion x 2 was appreciated.  Heaney clamps were placed just inferior to the mass and the right fallopian tube. The specimen is amputated and sent for frozen section. The 2 Heaney pedicles were secured with 0 Vicryl.  The remainder of the abdomen was inspected and a normal appendix is appreciated. Upper abdomen was palpated. Should have a small and normal gallbladder, normal liver surface bilaterally palpable normal kidneys.  There is no evidence of miliary disease. The contralateral ovary was inspected and noted to be within normal limits. Uterus is approximately 7 cm without any gross pathology.  The pelvis was copiously irrigated and drained. Frozen section returned that the findings were consistent with a mature teratoma. Hemostasis was assured.  Enspiril  was injected into the subcutaneous tissue. .The subcutaneous layer was reapproximated with 2-0 Vicryl. Hemostasis was observed. The skin was approximated running subcuticular 4.0 Vicryl sutures in a running subcuticular fashion. . Instrument, sponge, and needle counts were correct prior to abdominal closure and at the conclusion of the case.   Estimated Blood Loss: Minimal  Drains: Foley draining clear urine  Specimens: Right ovary and fallopian tube         Complications: None         Disposition: Recovery room in stable condition

## 2012-04-21 NOTE — H&P (View-Only) (Signed)
Consult Note: Gyn-Onc   Susan West 41 y.o. female  Chief Complaint  Patient presents with  . Pelvic mass    New Consult        HPI: 41 year old white female seen in consultation request of Dr. Leda Quail regarding a newly diagnosed complex pelvic mass. Patient indicates that she saw Dr. Hyacinth Meeker for evaluation of her IUD which needed to be changed. In the course of evaluation the mass was found. Ultrasound describes the mass is a right adnexal mass measuring 12 x 9 x 10.2 x 10.8 cm. There cystic areas as well as solid areas with calcifications. Arterial blood flow was seen within the mass. The left ovary measures 4 x 2.7 x 2.7 cm. Uterus appears normal. Patient does have some liver cysts. CA 125 is 14.9 units per mL.  The patient reports some vague abdominal pressure and slight change in bowel habits.  She has a past history of a cesarean section but no other abdominal surgery. She's currently using a Mirena IUD.  Review of Systems:10 point review of systems is negative as noted above.   Vitals: Blood pressure 100/58, pulse 70, temperature 98.4 F (36.9 C), temperature source Oral, resp. rate 20, height 5' 2.64" (1.591 m), weight 120 lb 14.4 oz (54.84 kg).  Physical Exam: General : The patient is a healthy woman in no acute distress.  HEENT: normocephalic, extraoccular movements normal; neck is supple without thyromegally  Lynphnodes: Supraclavicular and inguinal nodes not enlarged  Abdomen: Soft, non-tender, no ascites, no organomegally, no masses, no hernias  Pelvic:  EGBUS: Normal female  Vagina: Normal, no lesions  Urethra and Bladder: Normal, non-tender  Cervix: Normal Uterus: Normal size and pushed posteriorly by a cystic mass in the anterior pelvis measuring approximately 12 cm in diameter. There is no cul-de-sac nodularity in the mass is mobile.   Rectal: normal sphincter tone, no masses, no blood  Lower extremities: No edema or varicosities. Normal range of motion     Assessment/Plan: 41 year old complex pelvic mass. There are some characteristics that suggest possibility of malignancy (such as increased blood flow and solid areas). CA 125 value of 14 is somewhat reassuring.  Nonetheless, recommend the patient undergo resection of the mass through her prior Pfannenstiel incision obtaining intraoperative frozen section to guide further surgery. The patient would like to have a gynecologic oncologist participating in the surgery should this be cancer (again had indicated the patient and her mother I believe this is most likely benign). 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  We will contact Dr. Rondel Baton office to coordinate surgical scheduling. Allergies  Allergen Reactions  . Penicillins Nausea Only    Extreme stomach pain    Past Medical History  Diagnosis Date  . Pelvic mass in female   . IUD   . Headache, migraine     Past Surgical History  Procedure Date  . Nasal sinus surgery     1999  . Cesarean section 06/2000  . Breast enhancement surgery 2002    Current Outpatient Prescriptions  Medication Sig Dispense Refill  . BIOTIN PO Take 1 capsule by mouth.      . Coenzyme Q10 (CO Q 10 PO) Take 1 capsule by mouth.      . fexofenadine-pseudoephedrine (ALLEGRA-D 24) 180-240 MG per 24 hr tablet Take 1  tablet by mouth daily. 1/2 tablet daily      . Omega-3 Fatty Acids (OMEGA-3 FISH OIL PO) Take 1 tablet by mouth.      [name] VITAMIN E PO Take 1 capsule by mouth.        History   Social History  . Marital Status: Married    Spouse Name: N/A    Number of Children: N/A  . Years of Education: N/A   Occupational History  . Not on file.   Social History Main Topics  . Smoking status: Never Smoker   . Smokeless tobacco: Not on file  . Alcohol Use: Yes     rare  . Drug Use: No   . Sexually Active: Yes   Other Topics Concern  . Not on file   Social History Narrative  . No narrative on file    Family History  Problem Relation Age of Onset  . Cancer Father     prostate      Jeannette Corpus, MD 04/03/2012, 11:23 AM

## 2012-04-21 NOTE — Anesthesia Preprocedure Evaluation (Addendum)
Anesthesia Evaluation  Patient identified by MRN, date of birth, ID band Patient awake    Reviewed: Allergy & Precautions, H&P , NPO status , Patient's Chart, lab work & pertinent test results  History of Anesthesia Complications (+) PONV and AWARENESS UNDER ANESTHESIA  Airway Mallampati: II TM Distance: >3 FB Neck ROM: full    Dental No notable dental hx. (+) Teeth Intact and Dental Advisory Given   Pulmonary asthma ,  breath sounds clear to auscultation  Pulmonary exam normal       Cardiovascular Exercise Tolerance: Good negative cardio ROS  Rhythm:regular Rate:Normal     Neuro/Psych  Headaches, negative neurological ROS  negative psych ROS   GI/Hepatic negative GI ROS, Neg liver ROS,   Endo/Other  negative endocrine ROS  Renal/GU negative Renal ROS  negative genitourinary   Musculoskeletal   Abdominal   Peds  Hematology negative hematology ROS (+)   Anesthesia Other Findings   Reproductive/Obstetrics negative OB ROS                         Anesthesia Physical Anesthesia Plan  ASA: II  Anesthesia Plan: General   Post-op Pain Management:    Induction: Intravenous  Airway Management Planned: Oral ETT  Additional Equipment:   Intra-op Plan:   Post-operative Plan: Extubation in OR  Informed Consent: I have reviewed the patients History and Physical, chart, labs and discussed the procedure including the risks, benefits and alternatives for the proposed anesthesia with the patient or authorized representative who has indicated his/her understanding and acceptance.   Dental Advisory Given  Plan Discussed with: CRNA and Surgeon  Anesthesia Plan Comments:         Anesthesia Quick Evaluation

## 2012-04-21 NOTE — Progress Notes (Signed)
Day of Surgery Procedure(s) (LRB): EXPLORATORY LAPAROTOMY (N/A) SALPINGO OOPHERECTOMY (Right)  Subjective: Patient stating "I don't want to move."  Reporting intermittent nausea and incisional pain.    Objective: Vital signs in last 24 hours: Temp:  [97 F (36.1 C)-98.6 F (37 C)] 97 F (36.1 C) (10/01 1310) Pulse Rate:  [60-82] 60  (10/01 1310) Resp:  [12-20] 14  (10/01 1357) BP: (80-118)/(40-72) 80/40 mmHg (10/01 1310) SpO2:  [100 %] 100 % (10/01 1357)   Physical Examination: General: no distress and sleeping.  Responsive to verbal commands. Resp: Mildly diminished in bilateral bases Cardio: regular rate and rhythm, S1, S2 normal, no murmur, click, rub or gallop GI: incision: low transverse dressing dry and intact with light shadowing on the left lower section and abdomen soft, no bowel sounds auscultated Extremities: extremities normal, atraumatic, no cyanosis or edema  Assessment: 41 y.o. s/p Procedure(s): EXPLORATORY LAPAROTOMY SALPINGO OOPHERECTOMY: stable Pain:  Pain is well-controlled on PCA.  Pt resting quietly.    CV:   Hypotensive post-operatively.  Call received from 5 West at 1:42pm about patient's vital signs from arrival to the floor by PACU RN, Verlon Au.  Reporting initial BP at 84/60 with pulse of 56 at 1:40pm.  Next BP check resulted 79/60 at 1:50 pm.  Telford Nab, RN notified in the OR with Dr. Nelly Rout and Dr. Tamela Oddi.  Order for 500 cc NS bolus given to Lelon Perla, AD.  Rapid Response RN called to the floor by staff.  Upon arrival to the floor, patient resting quietly.  Responding to verbal stimuli.  At 14:19, manual BP of the left arm 64/42 with pulse of 77 and respirations 12 with patient resting quietly.  Telford Nab, CNS at the bedside.  Another order for 500 cc NS bolus given.  Second bolus started at 14:21.  At 14:33, BP 82/64, pulse 72.  At 14:51, pt resting quietly, stating "I feel nauseated.  Pain is a little better."  BP at that time 90/58  with pulse of 77.  Mother at the bedside.  At 15:06, pt reporting moderate incisional pain and requesting ice chips.  BP 90/60, pulse 78.  Pain medicine administered via PCA.         Plan: Continue to monitor vitals every hour Complete second 500 cc NS bolus and increase maintenance IVF to 200 cc/hr for three hours then re-evaluate Continue post operative care and orders     LOS: 0 days    CROSS, MELISSA DEAL 04/21/2012, 2:35 PM

## 2012-04-21 NOTE — Progress Notes (Signed)
Pt's Blood pressure 82/44; iv fluid bolus begun;  pt states she feels better

## 2012-04-21 NOTE — Transfer of Care (Signed)
Immediate Anesthesia Transfer of Care Note  Patient: Susan West  Procedure(s) Performed: Procedure(s) (LRB) with comments: EXPLORATORY LAPAROTOMY (N/A) SALPINGO OOPHERECTOMY (Right)  Patient Location: PACU  Anesthesia Type: General  Level of Consciousness: awake, alert , oriented and patient cooperative  Airway & Oxygen Therapy: Patient Spontanous Breathing and Patient connected to face mask oxygen  Post-op Assessment: Report given to PACU RN, Post -op Vital signs reviewed and stable and Patient moving all extremities  Post vital signs: Reviewed and stable  Complications: No apparent anesthesia complications

## 2012-04-21 NOTE — Progress Notes (Signed)
On arrival to floor, pt's BP 84/40, pt c/0 nausea; no vag bleeding seen, abd dsg small spot of drainage; pt's color pale, pt responds appropriately; Earlene Plater, NP paged; call in to Dr. Nelly Rout, rapid response team called

## 2012-04-22 ENCOUNTER — Encounter (HOSPITAL_COMMUNITY): Payer: Self-pay | Admitting: Gynecologic Oncology

## 2012-04-22 LAB — CBC
MCH: 31.8 pg (ref 26.0–34.0)
MCV: 89.9 fL (ref 78.0–100.0)
Platelets: 201 10*3/uL (ref 150–400)
RBC: 3.65 MIL/uL — ABNORMAL LOW (ref 3.87–5.11)
RDW: 11.6 % (ref 11.5–15.5)
WBC: 7.5 10*3/uL (ref 4.0–10.5)

## 2012-04-22 LAB — BASIC METABOLIC PANEL
Calcium: 8 mg/dL — ABNORMAL LOW (ref 8.4–10.5)
Creatinine, Ser: 0.59 mg/dL (ref 0.50–1.10)
GFR calc non Af Amer: >90 mL/min (ref >90)
Sodium: 132 mEq/L — ABNORMAL LOW (ref 135–145)

## 2012-04-22 MED ORDER — IBUPROFEN 600 MG PO TABS
600.0000 mg | ORAL_TABLET | Freq: Four times a day (QID) | ORAL | Status: DC | PRN
  Administered 2012-04-22 – 2012-04-23 (×5): 600 mg via ORAL
  Filled 2012-04-22 (×3): qty 1

## 2012-04-22 MED ORDER — ACETAMINOPHEN 325 MG PO TABS
650.0000 mg | ORAL_TABLET | Freq: Four times a day (QID) | ORAL | Status: DC | PRN
  Administered 2012-04-22 – 2012-04-23 (×2): 650 mg via ORAL
  Filled 2012-04-22 (×3): qty 2

## 2012-04-22 NOTE — Progress Notes (Signed)
1 Day Post-Op Procedure(s) (LRB): EXPLORATORY LAPAROTOMY (N/A) SALPINGO OOPHERECTOMY (Right)  Subjective: Patient reports incisional soreness intermittently.  Requesting ibuprofen for pain, stating "I do not do well taking narcotics."  Tolerating full liquid diet this am.  Denies passing flatus but reporting episodes of belching.  Denies nausea, vomiting, dizziness, chest pain, or dyspnea.   Objective: Vital signs in last 24 hours: Temp:  [94.2 F (34.6 C)-98.3 F (36.8 C)] 98.3 F (36.8 C) (10/02 0545) Pulse Rate:  [60-86] 82  (10/02 0545) Resp:  [9-17] 14  (10/02 0545) BP: (64-107)/(40-74) 103/59 mmHg (10/02 0545) SpO2:  [100 %] 100 % (10/02 0816) FiO2 (%):  [38 %] 38 % (10/01 1948) Weight:  [120 lb 14.4 oz (54.84 kg)] 120 lb 14.4 oz (54.84 kg) (10/01 1330) Last BM Date: 04/20/12  Intake/Output from previous day: 10/01 0701 - 10/02 0700 In: 6687.9 [P.O.:1080; I.V.:4607.9; IV Piggyback:1000] Out: 3425 [Urine:3375; Blood:50]  Physical Examination: General: alert, cooperative and no distress Resp: clear to auscultation bilaterally Cardio: regular rate and rhythm, S1, S2 normal, no murmur, click, rub or gallop GI: normal findings: bowel sounds normal and soft, mildly tender on palpation and incision: low transverse incision with steris lightly stained with sanguinous drainage.  Plan to reinforce steri strips Extremities: extremities normal, atraumatic, no cyanosis or edema  Labs: WBC/Hgb/Hct/Plts:  7.5/11.6/32.8/201 (10/02 0405) BUN/Cr/glu/ALT/AST/amyl/lip:  5/0.59/--/--/--/--/-- (10/02 0405)  Assessment: 41 y.o. s/p Procedure(s): EXPLORATORY LAPAROTOMY SALPINGO OOPHERECTOMY: stable Pain:  Pain is well-controlled.  Patient not using PCA Dilaudid due to nausea with its use.  Requesting ibuprofen for pain.  CV:  Mildly hypotensive.  Patient asymptomatic.  Plan to monitor.  GI:  Tolerating po: Yes     FEN: Mild hyponatremia post-operatively, Na+ 132 this am  Prophylaxis:  intermittent pneumatic compression boots.  Plan: Advance diet Encourage ambulation Advance to PO medication Discontinue IV fluids Saline lock Tylenol 650mg  Q6H PRN and Ibuprofen 600mg  Q6H PRN ordered per pt request for pain control Plan for possible discharge in the am   LOS: 1 day    Beyonce Sawatzky DEAL 04/22/2012, 9:44 AM

## 2012-04-22 NOTE — Care Management Note (Signed)
    Page 1 of 1   04/24/2012     8:13:32 AM   CARE MANAGEMENT NOTE 04/24/2012  Patient:  Susan West, Susan West   Account Number:  [id number]  Date Initiated:  04/22/2012  Documentation initiated by:  Lorenda Ishihara  Subjective/Objective Assessment:   41 yo female admitted s/p expl lap with oopherectomy. PTA lived at home with children.     Action/Plan:   Anticipated DC Date:  04/24/2012   Anticipated DC Plan:  HOME/SELF CARE      DC Planning Services  CM consult      Choice offered to / List presented to:             Status of service:  Completed, signed off Medicare Important Message given?   (If response is "NO", the following Medicare IM given date fields will be blank) Date Medicare IM given:   Date Additional Medicare IM given:    Discharge Disposition:  HOME/SELF CARE  Per UR Regulation:  Reviewed for med. necessity/level of care/duration of stay  If discussed at Long Length of Stay Meetings, dates discussed:    Comments:

## 2012-04-23 MED ORDER — INFLUENZA VIRUS VACC SPLIT PF IM SUSP
0.5000 mL | INTRAMUSCULAR | Status: AC
  Administered 2012-04-23: 0.5 mL via INTRAMUSCULAR
  Filled 2012-04-23: qty 0.5

## 2012-04-23 MED ORDER — SIMETHICONE 80 MG PO CHEW
80.0000 mg | CHEWABLE_TABLET | Freq: Four times a day (QID) | ORAL | Status: DC | PRN
  Administered 2012-04-23 (×2): 80 mg via ORAL
  Filled 2012-04-23: qty 1

## 2012-04-23 MED ORDER — BISACODYL 10 MG RE SUPP
10.0000 mg | Freq: Once | RECTAL | Status: AC
  Administered 2012-04-23: 10 mg via RECTAL
  Filled 2012-04-23: qty 1

## 2012-04-23 MED ORDER — OXYCODONE-ACETAMINOPHEN 5-325 MG PO TABS: 0.5000 | ORAL_TABLET | ORAL | Status: DC | PRN

## 2012-04-23 NOTE — Discharge Summary (Signed)
Physician Discharge Summary  Patient ID: Susan West MRN: [id number] DOB/AGE: 1970/12/16 41 y.o.  Admit date: 04/21/2012 Discharge date: 04/23/2012  Admission Diagnoses: Ovarian cyst  Discharge Diagnoses:  Principal Problem:  *Ovarian cyst  Discharged Condition: good  Hospital Course: On 04/21/2012, the patient underwent the following: Procedure(s): EXPLORATORY LAPAROTOMY SALPINGO OOPHERECTOMY.   The postoperative course was uneventful.  She was discharged to home on postoperative day 2 tolerating a regular diet.  Consults: None  Significant Diagnostic Studies: None  Treatments: surgery: See above  Discharge Exam: Blood pressure 99/64, pulse 89, temperature 98.5 F (36.9 C), temperature source Oral, resp. rate 18, height 5' 2.64" (1.591 m), weight 120 lb 14.4 oz (54.84 kg), SpO2 99.00%. General appearance: alert, cooperative and no distress Resp: clear to auscultation bilaterally Cardio: regular rate and rhythm, S1, S2 normal, no murmur, click, rub or gallop GI: normal findings: bowel sounds normal and soft, tender on palpation due to incisional soreness Extremities: extremities normal, atraumatic, no cyanosis or edema Incision/Wound: Low transverse incision with steri strips clean, dry, and intact  Disposition:   Discharge Orders    Future Appointments: Provider: Department: Dept Phone: Center:   05/26/2012 2:00 PM [facility] [phone] [facility]   05/28/2012 12:45 PM Laurette Schimke, MD PHD [facility] [phone] None     Future Orders Please Complete By Expires   Diet - low sodium heart healthy      Increase activity slowly      Driving Restrictions      Comments:   No driving for 2 weeks.  Do not take narcotics and drive.   Lifting restrictions      Comments:   No lifting greater than 10 lbs.   Sexual Activity Restrictions      Comments:   No sexual activity, nothing in the vagina, for 6 weeks.   Call MD for:  temperature  >100.4      Call MD for:  persistant nausea and vomiting      Call MD for:  severe uncontrolled pain      Call MD for:  redness, tenderness, or signs of infection (pain, swelling, redness, odor or green/yellow discharge around incision site)      Call MD for:  difficulty breathing, headache or visual disturbances      Call MD for:  hives      Call MD for:  persistant dizziness or light-headedness      Call MD for:  extreme fatigue          Medication List     As of 04/23/2012  8:36 AM    TAKE these medications         BIOTIN PO   Take 1 capsule by mouth.      CO Q 10 PO   Take 1 capsule by mouth.      doxycycline 100 MG tablet   Commonly known as: VIBRA-TABS   Take 100 mg by mouth daily.      fexofenadine-pseudoephedrine 180-240 MG per 24 hr tablet   Commonly known as: ALLEGRA-D 24   Take 1 tablet by mouth daily. 1/2 tablet daily      fluticasone 50 MCG/ACT nasal spray   Commonly known as: FLONASE   Place 1 spray into the nose daily.      OMEGA-3 FISH OIL PO   Take 1 tablet by mouth.      oxyCODONE-acetaminophen 5-325 MG per tablet   Commonly known as: PERCOCET/ROXICET   Take 0.5-1  tablets by mouth every 4 (four) hours as needed (moderate to severe pain (when tolerating fluids). May break tablets in half.).      PATANASE NA   Place 1 spray into the nose daily.           Follow-up Information    Follow up with Laurette Schimke, MD PHD. On 05/28/2012. (at 12:45pm)    Contact information:   [address] [place] [address] [phone]         Signed: CROSS, MELISSA DEAL 04/23/2012, 8:36 AM

## 2012-04-24 ENCOUNTER — Telehealth: Payer: Self-pay | Admitting: Gynecologic Oncology

## 2012-04-24 NOTE — Telephone Encounter (Signed)
Post op telephone call to check patient status.  Patient describes expected post operative status.  Adequate PO intake reported.  Bowels and bladder functioning without difficulty.  Pain minimal.  Reportable signs and symptoms reviewed.  Follow up appt arranged for November 7.

## 2012-05-18 ENCOUNTER — Telehealth: Payer: Self-pay | Admitting: Gynecologic Oncology

## 2012-05-18 NOTE — Telephone Encounter (Signed)
Called to speak with patient about abdominal incision.  Message left to call back to the office for any problems or if the superficial open area on the incision had not healed.

## 2012-05-26 ENCOUNTER — Ambulatory Visit
Admission: RE | Admit: 2012-05-26 | Discharge: 2012-05-26 | Disposition: A | Discharge status: Home / Self Care | Payer: BC Managed Care – PPO | Source: Ambulatory Visit | Attending: Obstetrics & Gynecology | Admitting: Obstetrics & Gynecology

## 2012-05-26 DIAGNOSIS — Z1231 Encounter for screening mammogram for malignant neoplasm of breast: Secondary | ICD-10-CM

## 2012-05-28 ENCOUNTER — Encounter: Payer: Self-pay | Admitting: Gynecologic Oncology

## 2012-05-28 ENCOUNTER — Ambulatory Visit: Payer: BC Managed Care – PPO | Attending: Gynecologic Oncology | Admitting: Gynecologic Oncology

## 2012-05-28 ENCOUNTER — Ambulatory Visit: Payer: BC Managed Care – PPO | Admitting: Lab

## 2012-05-28 ENCOUNTER — Encounter: Payer: Self-pay | Admitting: *Deleted

## 2012-05-28 VITALS — BP 120/60 | HR 76 | Temp 98.7°F | Resp 16 | Ht 62.64 in | Wt 122.9 lb

## 2012-05-28 DIAGNOSIS — F988 Other specified behavioral and emotional disorders with onset usually occurring in childhood and adolescence: Secondary | ICD-10-CM | POA: Insufficient documentation

## 2012-05-28 DIAGNOSIS — Z975 Presence of (intrauterine) contraceptive device: Secondary | ICD-10-CM | POA: Insufficient documentation

## 2012-05-28 DIAGNOSIS — J45990 Exercise induced bronchospasm: Secondary | ICD-10-CM | POA: Insufficient documentation

## 2012-05-28 DIAGNOSIS — Z09 Encounter for follow-up examination after completed treatment for conditions other than malignant neoplasm: Secondary | ICD-10-CM | POA: Insufficient documentation

## 2012-05-28 DIAGNOSIS — N83209 Unspecified ovarian cyst, unspecified side: Secondary | ICD-10-CM

## 2012-05-28 LAB — FOLLICLE STIMULATING HORMONE: FSH: 4 m[IU]/mL

## 2012-05-28 NOTE — Patient Instructions (Addendum)
Follow up with Dr. Leda Quail.  Thank you very much Ms. Susan West for allowing me to provide care for you today.  I appreciate your confidence in choosing our Gynecologic Oncology team.  If you have any questions about your visit today please call our office and we will get back to you as soon as possible.  Maryclare Labrador. Cherlynn Popiel MD., PhD Gynecologic Oncology

## 2012-05-28 NOTE — Progress Notes (Signed)
POST OPERATIVE CHECK:  Susan West 41 y.o. female  Chief Complaint  Patient presents with  . Ovarian Cyst    Follow up        HPI: 41 year old female who ndicated that she saw Dr. Hyacinth West for evaluation of her IUD which needed to be changed. In the course of evaluation the mass was found. Ultrasound describes the mass is a right adnexal mass measuring 12 x 9 x 10.2 x 10.8 cm. There cystic areas as well as solid areas with calcifications. Arterial blood flow was seen within the mass. The left ovary measured 4 x 2.7 x 2.7 cm.   CA 125 is 14.9 units per mL.  On 04/21/2012 she underwent a right salpingo-oophorectomy. Final pathology was consistent with a mature cystic teratoma measuring 8 cm.  The patient reports feeling hot with associated tachycardia.  States that this has occurred since the replacement of her IUD that was quite recent. On review of the recent laboratory tests since the episodes of flashes her white blood count is within normal limits.  Denies vaginal bleeding reports uterine cramping  Review of Systems: Reports periods of warmth no diarrhea constipation nausea vomiting reports mild abdominal cramping. Denies recent check of her IUD strings Vitals: Blood pressure 120/60, pulse 76, temperature 98.7 F (37.1 C), temperature source Oral, resp. rate 16, height 5' 2.64" (1.591 m), weight 122 lb 14.4 oz (55.747 kg).  Physical Exam: General : The patient is a healthy woman in no acute distress.  Abdomen: Soft, non-tender, no ascites, no organomegally, no masses, no hernias  Pelvic:  EGBUS: Normal female  Vagina: Normal, no lesions,  Urethra and Bladder: Normal, non-tender  Cervix: Normal, IUD strings visible Uterus: Normal size.  No cervical motion tenderness no vaginal discharge or bleeding Lower extremities: No edema or varicosities. Normal range of motion    Past Medical History  Diagnosis Date  . Pelvic mass in female   . IUD   . Headache, migraine   . PONV  (postoperative nausea and vomiting)   . Asthma     EXERCISE INDUCED  . ADD (attention deficit disorder)   . Environmental allergies     Past Surgical History  Procedure Date  . Nasal sinus surgery     1999  . Cesarean section 06/2000  . Breast enhancement surgery 2002  . Laparotomy 04/21/2012    Procedure: EXPLORATORY LAPAROTOMY;  Surgeon: Susan Schimke, MD PHD;  Location: [facility];  Service: Gynecology;  Laterality: N/A;  . Salpingoophorectomy 04/21/2012    Procedure: SALPINGO OOPHERECTOMY;  Surgeon: Susan Schimke, MD PHD;  Location: [facility];  Service: Gynecology;  Laterality: Right;    History   Social History  . Marital Status: Married    Spouse Name: N/A    Number of Children: N/A  . Years of Education: N/A   Occupational History  . Not on file.   Social History Main Topics  . Smoking status: Never Smoker   . Smokeless tobacco: Not on file  . Alcohol Use: Yes     Comment: rare  . Drug Use: No  . Sexually Active: Yes   Other Topics Concern  . Not on file   Social History Narrative  . No narrative on file    Family History  Problem Relation Age of Onset  . Cancer Father     prostate    Assessment/Plan: 41 year old who underwent right salpingo-oophorectomy 10/01//2013.  The  final pathology was consistent with a mature cystic teratoma.  She reports  feeling of warmth with associated tachycardia and malaise.  An FSH was then collected to assess whether or not she may be in the peri menopausal phase.   Followup with Dr. [name], [facility], MD 05/28/2012, 3:58 PM

## 2012-12-28 ENCOUNTER — Other Ambulatory Visit: Payer: Self-pay | Admitting: *Deleted

## 2012-12-28 MED ORDER — SERTRALINE HCL 25 MG PO TABS: 25.0000 mg | ORAL_TABLET | Freq: Every day | ORAL | Status: DC

## 2012-12-28 NOTE — Telephone Encounter (Signed)
Faxed request for Zoloft 25 mg to be taken TID Last Aex 03/04/12 Next Aex scheduled for 03/16/13

## 2013-01-25 ENCOUNTER — Telehealth: Payer: Self-pay | Admitting: Obstetrics & Gynecology

## 2013-01-25 NOTE — Telephone Encounter (Signed)
pt is requesting a refill for Zoloft pt is using [facility] in [place]

## 2013-01-25 NOTE — Telephone Encounter (Signed)
Patient needs refill on Zoloft. Needs to discuss other options.

## 2013-01-25 NOTE — Telephone Encounter (Signed)
Left message on CB#VM of need to  Return call to office for more information concerning her question of other options.

## 2013-01-26 ENCOUNTER — Telehealth: Payer: Self-pay

## 2013-01-26 NOTE — Telephone Encounter (Signed)
Dr Hyacinth Meeker, she is asking for a refill on her Zoloft and to discuss other options. I have left her 2 messages and she has called me back but then I was not available. Is it ok to refill? I will get her chart for you.

## 2013-01-26 NOTE — Telephone Encounter (Signed)
Dr Hyacinth Meeker, I left Susan West a message that she should have 5 refills at the pharmacy per chart. We refilled in 6/14. I will check with her tomorrow to see what other options she is wanting to discuss.

## 2013-01-27 NOTE — Telephone Encounter (Signed)
Spoke with patient-states she has been taking Zoloft 25mg  4 tablets/day. She recently went to get her rx refilled, but pharmacy said was too early since rx said one daily. Would like refill until AEX 03/16/13, but also not sure if this is really working for her. Has been off of it for the last 2 days and noticed increase in sweating. While on this she still has night sweats but not as bad. Would like to discuss at her AEX weight gain and ? If she should repeat US secondary pain on the other side? She lost her job and does not currently have insurance. Please advise 1. If we can refill zoloft, 2. Does she need appointment sooner.

## 2013-01-27 NOTE — Telephone Encounter (Signed)
Ok to [employer] Zoloft 100mg .  See how she feels about waiting until Aug.

## 2013-01-28 MED ORDER — SERTRALINE HCL 100 MG PO TABS: 100.0000 mg | ORAL_TABLET | Freq: Every day | ORAL | Status: DC

## 2013-01-28 NOTE — Telephone Encounter (Signed)
7/10 left detailed message for patient that rx has been called in and to call back if not able to wait until 8/14 for appointment.

## 2013-02-16 ENCOUNTER — Other Ambulatory Visit: Payer: Self-pay | Admitting: Obstetrics & Gynecology

## 2013-02-17 NOTE — Telephone Encounter (Signed)
Pt needs appt for evaluation.  RX denied with pharmacy.  Message left for pt to return call regarding refill request.

## 2013-02-17 NOTE — Telephone Encounter (Signed)
[facility]   Patient is requesting a refill of fluconazole

## 2013-02-18 MED ORDER — FLUCONAZOLE 150 MG PO TABS: 150.0000 mg | ORAL_TABLET | Freq: Once | ORAL | Status: DC

## 2013-02-18 NOTE — Telephone Encounter (Signed)
I spoke to pt to let her know she will need an evaluation before any medications can be filled and she stated she has had a long history of yeast infections and usually has an RX on hold for FLUCONAZOLE at her pharmacy just in case.  Pt is without insurance right now and states that you and Aidah helped her with a prescription last week.  Pt has appt for AEX on 03/16/13.   Chart on your desk.  Please advise if refill OK.

## 2013-02-18 NOTE — Telephone Encounter (Signed)
pt is returning a call to [facility]

## 2013-02-18 NOTE — Telephone Encounter (Signed)
I did refill for her Thurs PM.  Thanks for message.

## 2013-02-19 NOTE — Telephone Encounter (Signed)
Notified pt RX has been called in.  She has already picked up at pharmacy.

## 2013-02-25 NOTE — Telephone Encounter (Signed)
Left several messages, then a detailed message to call back if needs appointment sooner or we will see her at her AEX and discuss all questions.

## 2013-03-16 ENCOUNTER — Encounter: Payer: Self-pay | Admitting: Obstetrics & Gynecology

## 2013-03-16 ENCOUNTER — Ambulatory Visit: Payer: Self-pay | Admitting: Obstetrics & Gynecology

## 2013-06-08 ENCOUNTER — Telehealth: Payer: Self-pay | Admitting: *Deleted

## 2013-06-08 NOTE — Telephone Encounter (Signed)
Pt needs to schedule Annual Exam, appt was cancelled 8/08/27/12 not other appt scheduled.

## 2013-06-08 NOTE — Telephone Encounter (Signed)
Susan West, Can you put her on that Monday in December?

## 2013-06-10 ENCOUNTER — Encounter: Payer: Self-pay | Admitting: Obstetrics & Gynecology

## 2013-06-10 NOTE — Telephone Encounter (Signed)
11/20 lmtcb//kn

## 2013-06-10 NOTE — Telephone Encounter (Signed)
Returning a call to [place]. [id number] best time to be reached is 11:45-12:30.

## 2013-06-14 ENCOUNTER — Other Ambulatory Visit: Payer: Self-pay | Admitting: Obstetrics & Gynecology

## 2013-06-14 MED ORDER — SERTRALINE HCL 100 MG PO TABS: 100.0000 mg | ORAL_TABLET | Freq: Every day | ORAL | Status: DC

## 2013-06-14 NOTE — Telephone Encounter (Signed)
Spoke with patient-appointment for AEX made 06/28/13. Would like refill of zoloft if possible. States she has been off this for awhile and may not need to go back on it, but would like to have it available if needed. Please advise.

## 2013-06-14 NOTE — Telephone Encounter (Signed)
Rx done for 100mg  [facility]RF [facility] [place].

## 2013-06-28 ENCOUNTER — Ambulatory Visit: Payer: Self-pay | Admitting: Obstetrics & Gynecology

## 2013-06-29 ENCOUNTER — Ambulatory Visit (INDEPENDENT_AMBULATORY_CARE_PROVIDER_SITE_OTHER): Payer: Self-pay | Admitting: Obstetrics & Gynecology

## 2013-06-29 ENCOUNTER — Encounter: Payer: Self-pay | Admitting: Obstetrics & Gynecology

## 2013-06-29 VITALS — BP 112/60 | HR 76 | Resp 18 | Ht 62.5 in | Wt 138.0 lb

## 2013-06-29 DIAGNOSIS — R635 Abnormal weight gain: Secondary | ICD-10-CM

## 2013-06-29 DIAGNOSIS — R1031 Right lower quadrant pain: Secondary | ICD-10-CM

## 2013-06-29 DIAGNOSIS — Z0000 Encounter for general adult medical examination without abnormal findings: Secondary | ICD-10-CM

## 2013-06-29 DIAGNOSIS — Z01419 Encounter for gynecological examination (general) (routine) without abnormal findings: Secondary | ICD-10-CM

## 2013-06-29 LAB — HEMOGLOBIN, FINGERSTICK: Hemoglobin, fingerstick: 13.1 g/dL (ref 12.0–16.0)

## 2013-06-29 LAB — POCT URINALYSIS DIPSTICK
Glucose, UA: NEGATIVE
Leukocytes, UA: NEGATIVE
Nitrite, UA: NEGATIVE
Urobilinogen, UA: NEGATIVE

## 2013-06-29 MED ORDER — PHENTERMINE HCL 15 MG PO CAPS: 15.0000 mg | ORAL_CAPSULE | ORAL | Status: DC

## 2013-06-29 NOTE — Progress Notes (Signed)
42 y.o. G3P2 Divorced CaucasianF here for annual exam.  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  Has been doing some temp work off and on since then.  Started having a pinched nerve in right arm.  Was on steroid taper.  Has seen Dr. Thurston Hole.  Going to hand doctor for nerve testing.  Has chiropractor appt today.    Had laparoscopic RSO, mature teratoma.  Having some pain on left side for one month.  Experiencing some weight gain.  Also, has no VB due to Mirena IUD place 05/20/13.  Stopped the Zoloft over a month ago.  Was only taking 1/2 tablet.  Has stopped having hot flashes.   No LMP recorded. Patient is not currently having periods (Reason: IUD).          Sexually active: no  The current method of family planning is IUD.    Exercising: yes   Walking, running, aerobics Smoker:  no  Health Maintenance: Pap: 03/04/12 neg History of abnormal Pap:  Yes  2000 MMG: 05/26/2012 benign Colonoscopy:  never BMD:   2006 TDaP: 2011 allergic reaction (swelling) Screening Labs:    Hb today: 13.1  Urine today: neg   reports that she has never smoked. She has never used smokeless tobacco. She reports that she drinks about 0.5 ounces of alcohol per week. She reports that she does not use illicit drugs.  Past Medical History  Diagnosis Date  . Pelvic mass in female   . IUD   . Headache, migraine   . PONV (postoperative nausea and vomiting)   . Asthma     EXERCISE INDUCED  . ADD (attention deficit disorder)   . Environmental allergies   . Acne     adult-on doxycycline daily  . Depression 2006    and anxiety  . Osteopenia 2006  . Abnormal Pap smear     h/o    Past Surgical History  Procedure Laterality Date  . Nasal sinus surgery      1999  . Cesarean section  06/2000  . Breast enhancement surgery  2002  . Laparotomy  04/21/2012    Procedure: EXPLORATORY LAPAROTOMY;  Surgeon: Laurette Schimke, MD PHD;  Location: [facility];  Service: Gynecology;  Laterality: N/A;  .  Salpingoophorectomy  04/21/2012    Procedure: SALPINGO OOPHERECTOMY;  Surgeon: Laurette Schimke, MD PHD;  Location: [facility];  Service: Gynecology;  Laterality: Right;    Current Outpatient Prescriptions  Medication Sig Dispense Refill  . fexofenadine-pseudoephedrine (ALLEGRA-D 24) 180-240 MG per 24 hr tablet Take 1 tablet by mouth daily. 1/2 tablet daily      . Ibuprofen 200 MG CAPS Take by mouth daily.      [name] levonorgestrel (MIRENA) 20 MCG/24HR IUD 1 each by Intrauterine route once.      . Nutritional Supplements (JUICE PLUS FIBRE PO) Take by mouth.      . Olopatadine HCl (PATANASE NA) Place 1 spray into the nose daily.      [name] doxycycline (VIBRA-TABS) 100 MG tablet Take 100 mg by mouth daily.      . fluconazole (DIFLUCAN) 150 MG tablet Take 1 tablet (150 mg total) by mouth once. Take one tablet.  Repeat in 48 hours if symptoms are not completely resolved.  2 tablet  1  . fluticasone (FLONASE) 50 MCG/ACT nasal spray Place 1 spray into the nose as needed.       . Omega-3 Fatty Acids (OMEGA-3 FISH OIL PO) Take 1 tablet  by mouth.      . sertraline (ZOLOFT) 100 MG tablet Take 1 tablet (100 mg total) by mouth daily.  30 tablet  1   No current facility-administered medications for this visit.    Family History  Problem Relation Age of Onset  . Cancer Father     prostate, bone  . Hypertension Maternal Grandmother   . Hypertension Paternal Grandfather   . Heart attack Paternal Grandfather   . Hypertension Paternal Grandmother     ROS:  Pertinent items are noted in HPI.  Otherwise, a comprehensive ROS was negative.  Exam:   BP 112/60  Pulse 76  Resp 18  Ht 5' 2.5" (1.588 m)  Wt 138 lb (62.596 kg)  BMI 24.82 kg/m2  Weight change: +15lbs  Height: 5' 2.5" (158.8 cm)  Ht Readings from Last 3 Encounters:  06/29/13 5' 2.5" (1.588 m)  05/28/12 5' 2.64" (1.591 m)  04/21/12 5' 2.64" (1.591 m)    General appearance: alert, cooperative and appears stated age Head: Normocephalic, without obvious  abnormality, atraumatic Neck: no adenopathy, supple, symmetrical, trachea midline and thyroid normal to inspection and palpation Lungs: clear to auscultation bilaterally Breasts: normal appearance, no masses or tenderness Heart: regular rate and rhythm Abdomen: soft, non-tender; bowel sounds normal; no masses,  no organomegaly Extremities: extremities normal, atraumatic, no cyanosis or edema Skin: Skin color, texture, turgor normal. No rashes or lesions Lymph nodes: Cervical, supraclavicular, and axillary nodes normal. No abnormal inguinal nodes palpated Neurologic: Grossly normal   Pelvic: External genitalia:  no lesions              Urethra:  normal appearing urethra with no masses, tenderness or lesions              Bartholins and Skenes: normal                 Vagina: normal appearing vagina with normal color and discharge, no lesions              Cervix: no lesions              Pap taken: no Bimanual Exam:  Uterus:  normal size, contour, position, consistency, mobility, non-tender              Adnexa: normal adnexa and no mass, fullness, tenderness               Rectovaginal: Confirms               Anus:  normal sphincter tone, no lesions  A:  Well Woman with normal exam H/O RSO due to mature teratoma 10/13 Mirena IUD placed 10/13.  P:   Mammogram due.  Pt aware and will schedule. pap smear with neg HR HPV 8/13.  No pap today. TSH today.  If normal, will start phentermine 15mg  daily.  #30/1.  D/W pt risks including headache, pulmonary hypertension, and elevated BP. return annually or prn  An After Visit Summary was printed and given to the patient.

## 2013-06-29 NOTE — Patient Instructions (Signed)

## 2013-06-29 NOTE — Addendum Note (Signed)
Addended by: Jerene Bears on: 06/29/2013 06:11 PM   Modules accepted: Orders

## 2013-06-30 LAB — TSH: TSH: 2.789 u[IU]/mL (ref 0.350–4.500)

## 2013-07-05 ENCOUNTER — Telehealth: Payer: Self-pay

## 2013-07-05 NOTE — Telephone Encounter (Signed)
Message copied by Elisha Headland on Mon Jul 05, 2013 10:06 AM ------      Message from: Jerene Bears      Created: Fri Jul 02, 2013  5:25 AM       Inform nl. ------

## 2013-07-05 NOTE — Telephone Encounter (Signed)
Patient notified of results.

## 2013-07-05 NOTE — Telephone Encounter (Signed)
[email or web address]

## 2013-07-06 ENCOUNTER — Other Ambulatory Visit: Payer: Self-pay | Admitting: *Deleted

## 2013-07-06 MED ORDER — DOXYCYCLINE HYCLATE 100 MG PO TABS: 100.0000 mg | ORAL_TABLET | Freq: Every day | ORAL | Status: DC

## 2013-07-06 NOTE — Telephone Encounter (Signed)
Fax From: [facility] for Doxycycline 100 mg  Last Refilled: 06/08/12 x 1 year AEX: 06/29/13 no rx was given AEX Scheduled: 07/28/2014  Please advise.

## 2013-08-02 ENCOUNTER — Telehealth: Payer: Self-pay | Admitting: *Deleted

## 2013-08-02 NOTE — Telephone Encounter (Signed)
Yes. That is fine.  

## 2013-08-02 NOTE — Telephone Encounter (Signed)
Gabriel Cirri called patient with PUS appt for 08-05-13 at [facility]. Patient had questions about the difference between her PUS done in office and PUS being ordered at [facility]. Advised that since hospital has payment arrangements, we scheduled it there for flexibility we could not offer.  Patient reports she is now covered with [employer] that became effective on Friday 07-30-13.  She does not have any documentation of this. Advised that we can check in the computer to see if can verify coverage and call her back. Gabriel Cirri called patient back to advise that coverage is not able to be confirmed and patient states her first payment is not due until 08-22-13.  She requests we cancel her PUS appointment and she will call us back when ready to schedule.  Can we defer this order to 09-05-13?

## 2013-08-02 NOTE — Telephone Encounter (Signed)
As long as pt is ok waiting.

## 2013-08-03 NOTE — Telephone Encounter (Signed)
Susan West, please defer this order to 09-05-13. If she hasn't called by then, please call her back to check on insurance status.   Encounter closed.

## 2013-08-05 ENCOUNTER — Ambulatory Visit (HOSPITAL_COMMUNITY): Payer: BC Managed Care – PPO

## 2013-09-06 ENCOUNTER — Encounter: Payer: Self-pay | Admitting: Obstetrics & Gynecology

## 2013-09-06 ENCOUNTER — Telehealth: Payer: Self-pay | Admitting: Obstetrics & Gynecology

## 2013-09-06 ENCOUNTER — Ambulatory Visit (INDEPENDENT_AMBULATORY_CARE_PROVIDER_SITE_OTHER): Payer: BC Managed Care – PPO | Admitting: Obstetrics & Gynecology

## 2013-09-06 VITALS — BP 120/64 | HR 80 | Resp 20 | Ht 62.5 in | Wt 127.9 lb

## 2013-09-06 DIAGNOSIS — B9689 Other specified bacterial agents as the cause of diseases classified elsewhere: Secondary | ICD-10-CM

## 2013-09-06 DIAGNOSIS — R32 Unspecified urinary incontinence: Secondary | ICD-10-CM

## 2013-09-06 DIAGNOSIS — R3914 Feeling of incomplete bladder emptying: Secondary | ICD-10-CM

## 2013-09-06 DIAGNOSIS — A499 Bacterial infection, unspecified: Secondary | ICD-10-CM

## 2013-09-06 DIAGNOSIS — B373 Candidiasis of vulva and vagina: Secondary | ICD-10-CM

## 2013-09-06 DIAGNOSIS — B3731 Acute candidiasis of vulva and vagina: Secondary | ICD-10-CM

## 2013-09-06 DIAGNOSIS — N760 Acute vaginitis: Secondary | ICD-10-CM

## 2013-09-06 DIAGNOSIS — R339 Retention of urine, unspecified: Secondary | ICD-10-CM

## 2013-09-06 MED ORDER — METRONIDAZOLE 500 MG PO TABS
500.0000 mg | ORAL_TABLET | Freq: Two times a day (BID) | ORAL | Status: DC
Start: 2013-09-06 — End: 2014-03-01

## 2013-09-06 MED ORDER — FLUCONAZOLE 150 MG PO TABS: 150.0000 mg | ORAL_TABLET | Freq: Once | ORAL | Status: DC

## 2013-09-06 NOTE — Patient Instructions (Signed)
Please call if your symptoms do not completely resolve.

## 2013-09-06 NOTE — Telephone Encounter (Signed)
Patient thinks she may have a bacterial infection. Patient would like an appointment this afternoon with Dr.Miller. No appointments available.

## 2013-09-06 NOTE — Telephone Encounter (Signed)
Spoke with pt who is experiencing a grayish discharge, fishy odor, and left side "ovary pain" and back pain since last week. Has also noticed cloudy urine that has a fishy odor as well. Sched OV today with SM at 1:00.

## 2013-09-06 NOTE — Progress Notes (Signed)
PUS scheduled for 09-16-13 with Dr Sabra Heck here in office. Patient agreeable to date and time.

## 2013-09-06 NOTE — Progress Notes (Signed)
Subjective:     Patient ID: Susan West, female   DOB: [birth year], 43 y.o.   MRN: [id number]  HPI 43 yo G2P2 DWF here for a little less than a week's complaint of vaginal discharge.  Is greyish in appearance.  Having a fishy odor with urination.  Not present with anything else.  Had a little pelvic pain on her left and her urine was cloudy.  Has been soaking in epsom salt soaks.  Soaked in a bubble bath once but didn't do it again.    Also feeling she is having a little more issues with bladder.  Feels like she can void even after she's emptied her bladder.  She feels with an intense workout that she might leak a little as well.  Has experienced these symptoms primarily since her right ovary was removed due to a large dermoid done 10/13.  This was done via open procedure by Gyn/onc due to size.  Patient wants to be sure there is no issue with other ovary.  PUS was scheduled at hospital.  Patient wasn't to do here and never called back to schedule.  Will take care of that today.  Review of Systems  All other systems reviewed and are negative.       Objective:   Physical Exam  Constitutional: She is oriented to person, place, and time. She appears well-developed and well-nourished.  Abdominal: Soft. Bowel sounds are normal.  Genitourinary: There is no rash or tenderness on the right labia. There is no rash or tenderness on the left labia. Uterus is not deviated and not tender. Cervix exhibits discharge. Cervix exhibits no motion tenderness and no friability. Right adnexum displays no mass. Left adnexum displays no mass and no tenderness. Vaginal discharge (whitish and clumpy without odor) found.  Lymphadenopathy:       Right: No inguinal adenopathy present.       Left: No inguinal adenopathy present.  Neurological: She is alert and oriented to person, place, and time.  Skin: Skin is warm and dry.  Psychiatric: She has a normal mood and affect.   Wet smear:  Ph 5.0.  KOH with + Whiff, +  yeast.  Saline with neg trich, +WBCs    Assessment:     Bacterial vaginosis and Yeast Mild OAB with sensation of needing to void right after voiding     Plan:     Flagyl 500mg  bid x 7 days Diflucan 150mg  po x 1, repeat 72 hrs x 2 doses Refer to [employer].  Declines additional evaluation at this time. PUS scheduled.

## 2013-09-09 ENCOUNTER — Telehealth: Payer: Self-pay | Admitting: Obstetrics & Gynecology

## 2013-09-09 NOTE — Telephone Encounter (Signed)
Received fax confirming patient appointment at [facility] 02.26.2015 @ 1200. Patient aware

## 2013-09-15 ENCOUNTER — Telehealth: Payer: Self-pay | Admitting: Obstetrics & Gynecology

## 2013-09-15 NOTE — Telephone Encounter (Signed)
Patient canceled her PUS tomorrow due to weather please call and reschedule.

## 2013-09-15 NOTE — Telephone Encounter (Signed)
Left message for patient to call back to reschedule PUS.

## 2013-09-16 ENCOUNTER — Other Ambulatory Visit: Payer: BC Managed Care – PPO | Admitting: Obstetrics & Gynecology

## 2013-09-16 ENCOUNTER — Other Ambulatory Visit: Payer: BC Managed Care – PPO

## 2013-09-21 NOTE — Telephone Encounter (Signed)
Left message for patient to call back to rescheduled PUS

## 2013-09-21 NOTE — Telephone Encounter (Signed)
Returning a call to Sabrina. °

## 2013-09-21 NOTE — Telephone Encounter (Signed)
Telephoned patient. Rescheduled PUS. Advised of cancellation policy and cancellation fee. Patient agreeable.

## 2013-09-21 NOTE — Telephone Encounter (Signed)
Patient left message on after-hours answering machine returning Sabrina's call. She is hoping to come for her ultrasound this week.

## 2013-10-07 ENCOUNTER — Encounter: Payer: Self-pay | Admitting: Obstetrics & Gynecology

## 2013-10-07 ENCOUNTER — Ambulatory Visit (INDEPENDENT_AMBULATORY_CARE_PROVIDER_SITE_OTHER): Payer: BC Managed Care – PPO

## 2013-10-07 ENCOUNTER — Ambulatory Visit (INDEPENDENT_AMBULATORY_CARE_PROVIDER_SITE_OTHER): Payer: BC Managed Care – PPO | Admitting: Obstetrics & Gynecology

## 2013-10-07 VITALS — BP 102/60 | Ht 62.5 in | Wt 129.8 lb

## 2013-10-07 DIAGNOSIS — R1032 Left lower quadrant pain: Secondary | ICD-10-CM

## 2013-10-07 DIAGNOSIS — R1031 Right lower quadrant pain: Secondary | ICD-10-CM

## 2013-10-07 NOTE — Progress Notes (Signed)
43 y.o. G3P2 DWF here for a pelvic ultrasound due to intermittent LLQ pain and hx of RSO due to right dermoid.  Patient has an IUD for birth control and is doing well with this.  No VB.  No LMP recorded. Patient is not currently having periods (Reason: IUD).  Sexually active:  yes  Contraception: IUD  FINDINGS: UTERUS: 9.1 x 6.1 x 4.7cm EMS: 1.17mm with small amount of endometrial fluid noted, IUD in correct location ADNEXA:   Left ovary 2.4 x 1.9 x 2.0cm, appears normal   Right ovary is surgically absent CUL DE SAC: no free fluid  Images reviewed with patient.  All questions answered.  Pt has started with Susan West for PT.  She reports she has probably got some nerve damage on the right side but Susan West with start the next visit with some electrical stimulation of the pelvic floor muscles and biofeedback.  She is pleased with the referral.  Assessment:  LLQ pain, intermittent Plan: normal u/s.  Pt reassured.  She can handle the intermittent pain as long as the ovary is normal.  Declines any other treatment/evaluation at this time.  ~15 minutes spent with patient >50% of time was in face to face discussion of above.

## 2013-10-07 NOTE — Patient Instructions (Signed)
Please call with any new problems/issues.  AEX scheduled 1/16.

## 2014-03-01 ENCOUNTER — Encounter (HOSPITAL_COMMUNITY): Payer: Self-pay | Admitting: Emergency Medicine

## 2014-03-01 ENCOUNTER — Emergency Department (HOSPITAL_COMMUNITY)
Admission: EM | Admit: 2014-03-01 | Discharge: 2014-03-01 | Disposition: A | Discharge status: Home / Self Care | Payer: BC Managed Care – PPO | Source: Home / Self Care

## 2014-03-01 DIAGNOSIS — S139XXA Sprain of joints and ligaments of unspecified parts of neck, initial encounter: Secondary | ICD-10-CM

## 2014-03-01 DIAGNOSIS — G43909 Migraine, unspecified, not intractable, without status migrainosus: Secondary | ICD-10-CM

## 2014-03-01 DIAGNOSIS — G44209 Tension-type headache, unspecified, not intractable: Secondary | ICD-10-CM

## 2014-03-01 DIAGNOSIS — N949 Unspecified condition associated with female genital organs and menstrual cycle: Secondary | ICD-10-CM

## 2014-03-01 DIAGNOSIS — S161XXA Strain of muscle, fascia and tendon at neck level, initial encounter: Secondary | ICD-10-CM

## 2014-03-01 DIAGNOSIS — R102 Pelvic and perineal pain: Secondary | ICD-10-CM

## 2014-03-01 HISTORY — DX: Other seasonal allergic rhinitis: J30.2

## 2014-03-01 LAB — POCT PREGNANCY, URINE: Preg Test, Ur: NEGATIVE

## 2014-03-01 MED ORDER — KETOROLAC TROMETHAMINE 10 MG PO TABS: 10.0000 mg | ORAL_TABLET | Freq: Four times a day (QID) | ORAL | Status: DC | PRN

## 2014-03-01 MED ORDER — RIZATRIPTAN BENZOATE 10 MG PO TABS: 10.0000 mg | ORAL_TABLET | ORAL | Status: DC | PRN

## 2014-03-01 MED ORDER — KETOROLAC TROMETHAMINE 60 MG/2ML IM SOLN
INTRAMUSCULAR | Status: AC
  Filled 2014-03-01: qty 2

## 2014-03-01 MED ORDER — ONDANSETRON 4 MG PO TBDP
4.0000 mg | ORAL_TABLET | Freq: Once | ORAL | Status: AC
  Administered 2014-03-01: 4 mg via ORAL

## 2014-03-01 MED ORDER — ONDANSETRON 4 MG PO TBDP
ORAL_TABLET | ORAL | Status: AC
  Filled 2014-03-01: qty 1

## 2014-03-01 MED ORDER — TRAMADOL-ACETAMINOPHEN 37.5-325 MG PO TABS: 1.0000 | ORAL_TABLET | Freq: Four times a day (QID) | ORAL | Status: DC | PRN

## 2014-03-01 MED ORDER — KETOROLAC TROMETHAMINE 60 MG/2ML IM SOLN
45.0000 mg | Freq: Once | INTRAMUSCULAR | Status: AC
  Administered 2014-03-01: 45 mg via INTRAMUSCULAR

## 2014-03-01 NOTE — ED Provider Notes (Signed)
CSN: [id number]     Arrival date & time 03/01/14  1710 History   First MD Initiated Contact with Patient 03/01/14 1821     Chief Complaint  Patient presents with  . Headache  . Dizziness   (Consider location/radiation/quality/duration/timing/severity/associated sxs/prior Treatment) HPI Comments: 43 year old healthy-appearing female presents with a "migraine headache off and on for 2 weeks." She never been diagnosed with migraine headaches that has present to have had them by one of her physicians in the past. The headaches are primarily to the forehead, bitemporal area and around her eyes. She has had associated dizziness and occasional mild transient nausea but no vomiting. This afternoon she experienced acute pain to the left side of her neck that is made worse with head rotation. She denies problems with vision, speech, hearing, swallowing, photophobia. Denies focal paresthesias or weakness. She is currently having a frontal and bitemporal headache now that is not labeled as severe. She also complains of pain to the left pelvis that started over a week ago. It was a pressure type pain that felt like past medical sports symptoms. She has had a partial hysterectomy although she still has her uterus and a right oophorectomy. The left pelvic pain is feeling better over the past few days.   Past Medical History  Diagnosis Date  . IUD   . Headache, migraine   . PONV (postoperative nausea and vomiting)   . Asthma     EXERCISE INDUCED  . ADD (attention deficit disorder)   . Environmental allergies   . Acne     adult-on doxycycline daily  . Depression 2006    and anxiety  . Osteopenia 2006  . history of abnormal Pap smear        . Seasonal allergies    Past Surgical History  Procedure Laterality Date  . Nasal sinus surgery      1999  . Cesarean section  06/2000  . Breast enhancement surgery  2002    bilateral implants  . Laparotomy  04/21/2012    Procedure: EXPLORATORY LAPAROTOMY;   Surgeon: Janie Morning, MD PHD;  Location: [facility];  Service: Gynecology;  Laterality: N/A;  . Salpingoophorectomy  04/21/2012    Procedure: SALPINGO OOPHERECTOMY;  Surgeon: Janie Morning, MD PHD;  Location: [facility];  Service: Gynecology;  Laterality: Right;  . Intrauterine device (iud) insertion      mirena   Family History  Problem Relation Age of Onset  . Cancer Father     prostate, bone  . Hypertension Maternal Grandmother   . Hypertension Paternal Grandfather   . Heart attack Paternal Grandfather   . Hypertension Paternal Grandmother    History  Substance Use Topics  . Smoking status: Never Smoker   . Smokeless tobacco: Never Used  . Alcohol Use: 0.5 oz/week    1 drink(s) per week     Comment: rarely   OB History   Grav Para Term Preterm Abortions TAB SAB Ect Mult Living   3 2        2      Review of Systems  Constitutional: Negative.   HENT: Negative for congestion, ear pain, facial swelling, hearing loss, postnasal drip, rhinorrhea, sore throat, tinnitus and trouble swallowing.   Eyes: Negative for pain, discharge and redness.       No diplopia.  Respiratory: Negative for cough, chest tightness, shortness of breath, wheezing and stridor.   Cardiovascular: Negative.   Gastrointestinal: Negative for vomiting, abdominal pain, diarrhea and blood in stool.  Genitourinary:  Positive for pelvic pain. Negative for dysuria, frequency, hematuria, vaginal bleeding, vaginal discharge and vaginal pain.  Musculoskeletal: Positive for back pain.  Skin: Negative.   Neurological: Positive for dizziness and headaches. Negative for tremors, seizures, syncope, facial asymmetry, speech difficulty, weakness and numbness.  Hematological: Negative.   Psychiatric/Behavioral: Negative.     Allergies  Penicillins; Tdap; and Xanax  Home Medications   Prior to Admission medications   Medication Sig Start Date End Date Taking? Authorizing Provider  fexofenadine-pseudoephedrine (ALLEGRA-D 24)  180-240 MG per 24 hr tablet Take 1 tablet by mouth daily. 1/2 tablet daily   Yes Historical Provider, MD  Ibuprofen 200 MG CAPS Take 600 mg by mouth daily.    Yes Historical Provider, MD  Multiple Vitamin (MULTIVITAMIN) tablet Take 1 tablet by mouth daily. Ab cuts sunflower oil, fish oil, flax seed oi.   Yes Historical Provider, MD  Multiple Vitamins-Minerals (MULTIVITAMIN PO) Take by mouth. Focus factor vitamin daily   Yes Historical Provider, MD  Nutritional Supplements (JUICE PLUS FIBRE PO) Take by mouth.   Yes Historical Provider, MD  ketorolac (TORADOL) 10 MG tablet Take 1 tablet (10 mg total) by mouth 4 (four) times daily as needed. Take with food. 03/01/14   Janne Napoleon, NP  levonorgestrel (MIRENA) 20 MCG/24HR IUD 1 each by Intrauterine route once.    Historical Provider, MD  Olopatadine HCl (PATANASE NA) Place 1 spray into the nose daily.    Historical Provider, MD  rizatriptan (MAXALT) 10 MG tablet Take 1 tablet (10 mg total) by mouth as needed for migraine. May repeat in 2 hours if needed 03/01/14   Janne Napoleon, NP  traMADol-acetaminophen (ULTRACET) 37.5-325 MG per tablet Take 1 tablet by mouth every 6 (six) hours as needed. 03/01/14   Janne Napoleon, NP   BP 125/88  Pulse 85  Temp(Src) 98.5 F (36.9 C) (Oral)  Resp 12  SpO2 100% Physical Exam  Nursing note and vitals reviewed. Constitutional: She is oriented to person, place, and time. She appears well-developed and well-nourished. No distress.  HENT:  Head: Normocephalic and atraumatic.  Right Ear: External ear normal.  Left Ear: External ear normal.  Mouth/Throat: Oropharynx is clear and moist. No oropharyngeal exudate.  Eyes: Conjunctivae and EOM are normal. Pupils are equal, round, and reactive to light. Right eye exhibits no discharge. Left eye exhibits no discharge. No scleral icterus.  Neck: Normal range of motion. Neck supple. No thyromegaly present.  Tenderness to the left sternocleidomastoid muscle. No tenderness to the  posterior neck, cervical spine or paracervical musculature.  Cardiovascular: Normal rate, regular rhythm, normal heart sounds and intact distal pulses.   Pulmonary/Chest: Effort normal and breath sounds normal. No respiratory distress. She has no wheezes. She has no rales.  Abdominal: Soft. Bowel sounds are normal. She exhibits no distension and no mass. There is no tenderness. There is no rebound and no guarding.  No pelvic tenderness.  Musculoskeletal: Normal range of motion. She exhibits no edema.  Lymphadenopathy:    She has no cervical adenopathy.  Neurological: She is alert and oriented to person, place, and time. She has normal reflexes. She displays no atrophy and no tremor. No cranial nerve deficit or sensory deficit. She exhibits normal muscle tone. She displays a negative Romberg sign. She displays no seizure activity. Coordination and gait normal. GCS eye subscore is 4. GCS verbal subscore is 5. GCS motor subscore is 6.  Heel-to-toe normal  Skin: Skin is warm and dry. No rash noted. No erythema.  Psychiatric: She has a normal mood and affect. Her behavior is normal. Thought content normal.    ED Course  Procedures (including critical care time) Labs Review Labs Reviewed  POCT PREGNANCY, URINE    Imaging Review No results found.   MDM   1. Mixed migraine and muscle contraction headache   2. Strain of neck muscle, initial encounter   3. Pelvic pain in female    Toradol 45 mg IM now Toradol 10 mg by mouth Q6 hours when necessary headache Zofran 4 mg by mouth now Ultracet 1 tablet every 6 hours when necessary pain Refill Maxalt 10 mg. For worsening may return otherwise followup with PCP. Neurologic, abdominal and external pelvic exam unremarkable.    Janne Napoleon, NP 03/01/14 [phone]

## 2014-03-01 NOTE — ED Notes (Signed)
C/o headache for 2 weeks off and on and dizziness onset Sat. She had a migraine last week. Feels like she is spinning rather than the room.  States pain today at work and left work early.  C/o pain in her face under her eyes, forehead and behind her eyes. C/o pain on L side of her neck and back of neck.  It is sore to the touch. Also c/o left lower abdomen onset last week that comes and goes.  Had a salpingo-oopherectomy on R and uses the Takilma IUD.  Pain on side she still has ovary.

## 2014-03-01 NOTE — Discharge Instructions (Signed)
Headaches, Frequently Asked Questions MIGRAINE HEADACHES Q: What is migraine? What causes it? How can I treat it? A: Generally, migraine headaches begin as a dull ache. Then they develop into a constant, throbbing, and pulsating pain. You may experience pain at the temples. You may experience pain at the front or back of one or both sides of the head. The pain is usually accompanied by a combination of:  Nausea.  Vomiting.  Sensitivity to light and noise. Some people (about 15%) experience an aura (see below) before an attack. The cause of migraine is believed to be chemical reactions in the brain. Treatment for migraine may include over-the-counter or prescription medications. It may also include self-help techniques. These include relaxation training and biofeedback.  Q: What is an aura? A: About 15% of people with migraine get an "aura". This is a sign of neurological symptoms that occur before a migraine headache. You may see wavy or jagged lines, dots, or flashing lights. You might experience tunnel vision or blind spots in one or both eyes. The aura can include visual or auditory hallucinations (something imagined). It may include disruptions in smell (such as strange odors), taste or touch. Other symptoms include:  Numbness.  A "pins and needles" sensation.  Difficulty in recalling or speaking the correct word. These neurological events may last as long as 60 minutes. These symptoms will fade as the headache begins. Q: What is a trigger? A: Certain physical or environmental factors can lead to or "trigger" a migraine. These include:  Foods.  Hormonal changes.  Weather.  Stress. It is important to remember that triggers are different for everyone. To help prevent migraine attacks, you need to figure out which triggers affect you. Keep a headache diary. This is a good way to track triggers. The diary will help you talk to your healthcare professional about your condition. Q: Does  weather affect migraines? A: Bright sunshine, hot, humid conditions, and drastic changes in barometric pressure may lead to, or "trigger," a migraine attack in some people. But studies have shown that weather does not act as a trigger for everyone with migraines. Q: What is the link between migraine and hormones? A: Hormones start and regulate many of your body's functions. Hormones keep your body in balance within a constantly changing environment. The levels of hormones in your body are unbalanced at times. Examples are during menstruation, pregnancy, or menopause. That can lead to a migraine attack. In fact, about three quarters of all women with migraine report that their attacks are related to the menstrual cycle.  Q: Is there an increased risk of stroke for migraine sufferers? A: The likelihood of a migraine attack causing a stroke is very remote. That is not to say that migraine sufferers cannot have a stroke associated with their migraines. In persons under age 53, the most common associated factor for stroke is migraine headache. But over the course of a person's normal life span, the occurrence of migraine headache may actually be associated with a reduced risk of dying from cerebrovascular disease due to stroke.  Q: What are acute medications for migraine? A: Acute medications are used to treat the pain of the headache after it has started. Examples over-the-counter medications, NSAIDs, ergots, and triptans.  Q: What are the triptans? A: Triptans are the newest class of abortive medications. They are specifically targeted to treat migraine. Triptans are vasoconstrictors. They moderate some chemical reactions in the brain. The triptans work on receptors in your brain. Triptans help  to restore the balance of a neurotransmitter called serotonin. Fluctuations in levels of serotonin are thought to be a main cause of migraine.  °Q: Are over-the-counter medications for migraine effective? °A:  Over-the-counter, or "OTC," medications may be effective in relieving mild to moderate pain and associated symptoms of migraine. But you should see your caregiver before beginning any treatment regimen for migraine.  °Q: What are preventive medications for migraine? °A: Preventive medications for migraine are sometimes referred to as "prophylactic" treatments. They are used to reduce the frequency, severity, and length of migraine attacks. Examples of preventive medications include antiepileptic medications, antidepressants, beta-blockers, calcium channel blockers, and NSAIDs (nonsteroidal anti-inflammatory drugs). °Q: Why are anticonvulsants used to treat migraine? °A: During the past few years, there has been an increased interest in antiepileptic drugs for the prevention of migraine. They are sometimes referred to as "anticonvulsants". Both epilepsy and migraine may be caused by similar reactions in the brain.  °Q: Why are antidepressants used to treat migraine? °A: Antidepressants are typically used to treat people with depression. They may reduce migraine frequency by regulating chemical levels, such as serotonin, in the brain.  °Q: What alternative therapies are used to treat migraine? °A: The term "alternative therapies" is often used to describe treatments considered outside the scope of conventional Western medicine. Examples of alternative therapy include acupuncture, acupressure, and yoga. Another common alternative treatment is herbal therapy. Some herbs are believed to relieve headache pain. Always discuss alternative therapies with your caregiver before proceeding. Some herbal products contain arsenic and other toxins. °TENSION HEADACHES °Q: What is a tension-type headache? What causes it? How can I treat it? °A: Tension-type headaches occur randomly. They are often the result of temporary stress, anxiety, fatigue, or anger. Symptoms include soreness in your temples, a tightening band-like sensation  around your head (a "vice-like" ache). Symptoms can also include a pulling feeling, pressure sensations, and contracting head and neck muscles. The headache begins in your forehead, temples, or the back of your head and neck. Treatment for tension-type headache may include over-the-counter or prescription medications. Treatment may also include self-help techniques such as relaxation training and biofeedback. °CLUSTER HEADACHES °Q: What is a cluster headache? What causes it? How can I treat it? °A: Cluster headache gets its name because the attacks come in groups. The pain arrives with little, if any, warning. It is usually on one side of the head. A tearing or bloodshot eye and a runny nose on the same side of the headache may also accompany the pain. Cluster headaches are believed to be caused by chemical reactions in the brain. They have been described as the most severe and intense of any headache type. Treatment for cluster headache includes prescription medication and oxygen. °SINUS HEADACHES °Q: What is a sinus headache? What causes it? How can I treat it? °A: When a cavity in the bones of the face and skull (a sinus) becomes inflamed, the inflammation will cause localized pain. This condition is usually the result of an allergic reaction, a tumor, or an infection. If your headache is caused by a sinus blockage, such as an infection, you will probably have a fever. An x-ray will confirm a sinus blockage. Your caregiver's treatment might include antibiotics for the infection, as well as antihistamines or decongestants.  °REBOUND HEADACHES °Q: What is a rebound headache? What causes it? How can I treat it? °A: A pattern of taking acute headache medications too often can lead to a condition known as "rebound headache."   A pattern of taking too much headache medication includes taking it more than 2 days per week or in excessive amounts. That means more than the label or a caregiver advises. With rebound  headaches, your medications not only stop relieving pain, they actually begin to cause headaches. Doctors treat rebound headache by tapering the medication that is being overused. Sometimes your caregiver will gradually substitute a different type of treatment or medication. Stopping may be a challenge. Regularly overusing a medication increases the potential for serious side effects. Consult a caregiver if you regularly use headache medications more than 2 days per week or more than the label advises. ADDITIONAL QUESTIONS AND ANSWERS Q: What is biofeedback? A: Biofeedback is a self-help treatment. Biofeedback uses special equipment to monitor your body's involuntary physical responses. Biofeedback monitors:  Breathing.  Pulse.  Heart rate.  Temperature.  Muscle tension.  Brain activity. Biofeedback helps you refine and perfect your relaxation exercises. You learn to control the physical responses that are related to stress. Once the technique has been mastered, you do not need the equipment any more. Q: Are headaches hereditary? A: Four out of five (80%) of people that suffer report a family history of migraine. Scientists are not sure if this is genetic or a family predisposition. Despite the uncertainty, a child has a 50% chance of having migraine if one parent suffers. The child has a 75% chance if both parents suffer.  Q: Can children get headaches? A: By the time they reach high school, most young people have experienced some type of headache. Many safe and effective approaches or medications can prevent a headache from occurring or stop it after it has begun.  Q: What type of doctor should I see to diagnose and treat my headache? A: Start with your primary caregiver. Discuss his or her experience and approach to headaches. Discuss methods of classification, diagnosis, and treatment. Your caregiver may decide to recommend you to a headache specialist, depending upon your symptoms or other  physical conditions. Having diabetes, allergies, etc., may require a more comprehensive and inclusive approach to your headache. The National Head[employer] Foundation will provide, upon request, a list of [facility] physician members in your state. Document Released: 09/28/2003 Document Revised: 09/30/2011 Document Reviewed: 03/07/2008 [facility] Patient Information 2015 [place], [place]. This information is not intended to replace advice given to you by your health care provider. Make sure you discuss any questions you have with your health care provider.  Muscle Strain A muscle strain is an injury that occurs when a muscle is stretched beyond its normal length. Usually a small number of muscle fibers are torn when this happens. Muscle strain is rated in degrees. First-degree strains have the least amount of muscle fiber tearing and pain. Second-degree and third-degree strains have increasingly more tearing and pain.  Usually, recovery from muscle strain takes 1-2 weeks. Complete healing takes 5-6 weeks.  CAUSES  Muscle strain happens when a sudden, violent force placed on a muscle stretches it too far. This may occur with lifting, sports, or a fall.  RISK FACTORS Muscle strain is especially common in athletes.  SIGNS AND SYMPTOMS At the site of the muscle strain, there may be:  Pain.  Bruising.  Swelling.  Difficulty using the muscle due to pain or lack of normal function. DIAGNOSIS  Your health care provider will perform a physical exam and ask about your medical history. TREATMENT  Often, the best treatment for a muscle strain is resting, icing, and applying cold compresses  to the injured area.  HOME CARE INSTRUCTIONS   Use the PRICE method of treatment to promote muscle healing during the first 2-3 days after your injury. The PRICE method involves:  Protecting the muscle from being injured again.  Restricting your activity and resting the injured body part.  Icing your injury. To do this, put  ice in a plastic bag. Place a towel between your skin and the bag. Then, apply the ice and leave it on from 15-20 minutes each hour. After the third day, switch to moist heat packs.  Apply compression to the injured area with a splint or elastic bandage. Be careful not to wrap it too tightly. This may interfere with blood circulation or increase swelling.  Elevate the injured body part above the level of your heart as often as you can.  Only take over-the-counter or prescription medicines for pain, discomfort, or fever as directed by your health care provider.  Warming up prior to exercise helps to prevent future muscle strains. SEEK MEDICAL CARE IF:   You have increasing pain or swelling in the injured area.  You have numbness, tingling, or a significant loss of strength in the injured area. MAKE SURE YOU:   Understand these instructions.  Will watch your condition.  Will get help right away if you are not doing well or get worse. Document Released: 07/08/2005 Document Revised: 04/28/2013 Document Reviewed: 02/04/2013 [facility] Patient Information 2015 [place], [place]. This information is not intended to replace advice given to you by your health care provider. Make sure you discuss any questions you have with your health care provider.  Migraine Headache A migraine headache is an intense, throbbing pain on one or both sides of your head. A migraine can last for 30 minutes to several hours. CAUSES  The exact cause of a migraine headache is not always known. However, a migraine may be caused when nerves in the brain become irritated and release chemicals that cause inflammation. This causes pain. Certain things may also trigger migraines, such as:  Alcohol.  Smoking.  Stress.  Menstruation.  Aged cheeses.  Foods or drinks that contain nitrates, glutamate, aspartame, or tyramine.  Lack of sleep.  Chocolate.  Caffeine.  Hunger.  Physical  exertion.  Fatigue.  Medicines used to treat chest pain (nitroglycerine), birth control pills, estrogen, and some blood pressure medicines. SIGNS AND SYMPTOMS  Pain on one or both sides of your head.  Pulsating or throbbing pain.  Severe pain that prevents daily activities.  Pain that is aggravated by any physical activity.  Nausea, vomiting, or both.  Dizziness.  Pain with exposure to bright lights, loud noises, or activity.  General sensitivity to bright lights, loud noises, or smells. Before you get a migraine, you may get warning signs that a migraine is coming (aura). An aura may include:  Seeing flashing lights.  Seeing bright spots, halos, or zigzag lines.  Having tunnel vision or blurred vision.  Having feelings of numbness or tingling.  Having trouble talking.  Having muscle weakness. DIAGNOSIS  A migraine headache is often diagnosed based on:  Symptoms.  Physical exam.  A CT scan or MRI of your head. These imaging tests cannot diagnose migraines, but they can help rule out other causes of headaches. TREATMENT Medicines may be given for pain and nausea. Medicines can also be given to help prevent recurrent migraines.  HOME CARE INSTRUCTIONS  Only take over-the-counter or prescription medicines for pain or discomfort as directed by your health care provider.  The use of long-term narcotics is not recommended.  Lie down in a dark, quiet room when you have a migraine.  Keep a journal to find out what may trigger your migraine headaches. For example, write down:  What you eat and drink.  How much sleep you get.  Any change to your diet or medicines.  Limit alcohol consumption.  Quit smoking if you smoke.  Get 7-9 hours of sleep, or as recommended by your health care provider.  Limit stress.  Keep lights dim if bright lights bother you and make your migraines worse. SEEK IMMEDIATE MEDICAL CARE IF:   Your migraine becomes severe.  You have a  fever.  You have a stiff neck.  You have vision loss.  You have muscular weakness or loss of muscle control.  You start losing your balance or have trouble walking.  You feel faint or pass out.  You have severe symptoms that are different from your first symptoms. MAKE SURE YOU:   Understand these instructions.  Will watch your condition.  Will get help right away if you are not doing well or get worse. Document Released: 07/08/2005 Document Revised: 11/22/2013 Document Reviewed: 03/15/2013 [facility] Patient Information 2015 [place], [place]. This information is not intended to replace advice given to you by your health care provider. Make sure you discuss any questions you have with your health care provider.  Pelvic Pain Pelvic pain is pain felt below the belly button and between your hips. It can be caused by many different things. It is important to get help right away. This is especially true for severe, sharp, or unusual pain that comes on suddenly.  HOME CARE  Only take medicine as told by your doctor.  Rest as told by your doctor.  Eat a healthy diet, such as fruits, vegetables, and lean meats.  Drink enough fluids to keep your pee (urine) clear or pale yellow, or as told.  Avoid sex (intercourse) if it causes pain.  Apply warm or cold packs to your lower belly (abdomen). Use the type of pack that helps the pain.  Avoid situations that cause you stress.  Keep a journal to track your pain. Write down:  When the pain started.  Where it is located.  If there are things that seem to be related to the pain, such as food or your period.  Follow up with your doctor as told. GET HELP RIGHT AWAY IF:   You have heavy bleeding from the vagina.  You have more pelvic pain.  You feel lightheaded or pass out (faint).  You have chills.  You have pain when you pee or have blood in your pee.  You cannot stop having watery poop (diarrhea).  You cannot stop throwing  up (vomiting).  You have a fever or lasting symptoms for more than 3 days.  You have a fever and your symptoms suddenly get worse.  You are being physically or sexually abused.  Your medicine does not help your pain.  You have fluid (discharge) coming from your vagina that is not normal. MAKE SURE YOU:  Understand these instructions.  Will watch your condition.  Will get help if you are not doing well or get worse. Document Released: 12/25/2007 Document Revised: 01/07/2012 Document Reviewed: 10/28/2011 [facility] Patient Information 2015 [place], [place]. This information is not intended to replace advice given to you by your health care provider. Make sure you discuss any questions you have with your health care provider.  Tension Headache A  tension headache is a feeling of pain, pressure, or aching often felt over the front and sides of the head. The pain can be dull or can feel tight (constricting). It is the most common type of headache. Tension headaches are not normally associated with nausea or vomiting and do not get worse with physical activity. Tension headaches can last 30 minutes to several days.  CAUSES  The exact cause is not known, but it may be caused by chemicals and hormones in the brain that lead to pain. Tension headaches often begin after stress, anxiety, or depression. Other triggers may include:  Alcohol.  Caffeine (too much or withdrawal).  Respiratory infections (colds, flu, sinus infections).  Dental problems or teeth clenching.  Fatigue.  Holding your head and neck in one position too long while using a computer. SYMPTOMS   Pressure around the head.   Dull, aching head pain.   Pain felt over the front and sides of the head.   Tenderness in the muscles of the head, neck, and shoulders. DIAGNOSIS  A tension headache is often diagnosed based on:   Symptoms.   Physical examination.   A CT scan or MRI of your head. These tests may be  ordered if symptoms are severe or unusual. TREATMENT  Medicines may be given to help relieve symptoms.  HOME CARE INSTRUCTIONS   Only take over-the-counter or prescription medicines for pain or discomfort as directed by your caregiver.   Lie down in a dark, quiet room when you have a headache.   Keep a journal to find out what may be triggering your headaches. For example, write down:  What you eat and drink.  How much sleep you get.  Any change to your diet or medicines.  Try massage or other relaxation techniques.   Ice packs or heat applied to the head and neck can be used. Use these 3 to 4 times per day for 15 to 20 minutes each time, or as needed.   Limit stress.   Sit up straight, and do not tense your muscles.   Quit smoking if you smoke.  Limit alcohol use.  Decrease the amount of caffeine you drink, or stop drinking caffeine.  Eat and exercise regularly.  Get 7 to 9 hours of sleep, or as recommended by your caregiver.  Avoid excessive use of pain medicine as recurrent headaches can occur.  SEEK MEDICAL CARE IF:   You have problems with the medicines you were prescribed.  Your medicines do not work.  You have a change from the usual headache.  You have nausea or vomiting. SEEK IMMEDIATE MEDICAL CARE IF:   Your headache becomes severe.  You have a fever.  You have a stiff neck.  You have loss of vision.  You have muscular weakness or loss of muscle control.  You lose your balance or have trouble walking.  You feel faint or pass out.  You have severe symptoms that are different from your first symptoms. MAKE SURE YOU:   Understand these instructions.  Will watch your condition.  Will get help right away if you are not doing well or get worse. Document Released: 07/08/2005 Document Revised: 09/30/2011 Document Reviewed: 06/28/2011 [facility] Patient Information 2015 [place], [place]. This information is not intended to replace advice  given to you by your health care provider. Make sure you discuss any questions you have with your health care provider.

## 2014-03-01 NOTE — ED Provider Notes (Signed)
Medical screening examination/treatment/procedure(s) were performed by a resident physician or non-physician practitioner and as the supervising physician I was immediately available for consultation/collaboration.  Linna Darner, MD Family Medicine   Waldemar Dickens, MD 03/01/14 [phone]

## 2014-05-23 ENCOUNTER — Encounter (HOSPITAL_COMMUNITY): Payer: Self-pay | Admitting: Emergency Medicine

## 2014-07-28 ENCOUNTER — Ambulatory Visit: Payer: BC Managed Care – PPO | Admitting: Obstetrics & Gynecology

## 2014-08-18 ENCOUNTER — Encounter: Payer: Self-pay | Admitting: Certified Nurse Midwife

## 2014-08-18 ENCOUNTER — Ambulatory Visit (INDEPENDENT_AMBULATORY_CARE_PROVIDER_SITE_OTHER): Payer: BLUE CROSS/BLUE SHIELD | Admitting: Certified Nurse Midwife

## 2014-08-18 VITALS — BP 102/60 | HR 68 | Resp 16 | Ht 62.5 in | Wt 124.0 lb

## 2014-08-18 DIAGNOSIS — B9689 Other specified bacterial agents as the cause of diseases classified elsewhere: Secondary | ICD-10-CM

## 2014-08-18 DIAGNOSIS — B373 Candidiasis of vulva and vagina: Secondary | ICD-10-CM

## 2014-08-18 DIAGNOSIS — B3731 Acute candidiasis of vulva and vagina: Secondary | ICD-10-CM

## 2014-08-18 DIAGNOSIS — A499 Bacterial infection, unspecified: Secondary | ICD-10-CM

## 2014-08-18 DIAGNOSIS — N760 Acute vaginitis: Secondary | ICD-10-CM

## 2014-08-18 MED ORDER — FLUCONAZOLE 150 MG PO TABS: 150.0000 mg | ORAL_TABLET | Freq: Once | ORAL | Status: DC

## 2014-08-18 MED ORDER — METRONIDAZOLE 0.75 % VA GEL: 1.0000 | Freq: Two times a day (BID) | VAGINAL | Status: DC

## 2014-08-18 NOTE — Patient Instructions (Signed)
Monilial Vaginitis Vaginitis in a soreness, swelling and redness (inflammation) of the vagina and vulva. Monilial vaginitis is not a sexually transmitted infection. CAUSES  Yeast vaginitis is caused by yeast (candida) that is normally found in your vagina. With a yeast infection, the candida has overgrown in number to a point that upsets the chemical balance. SYMPTOMS   White, thick vaginal discharge.  Swelling, itching, redness and irritation of the vagina and possibly the lips of the vagina (vulva).  Burning or painful urination.  Painful intercourse. DIAGNOSIS  Things that may contribute to monilial vaginitis are:  Postmenopausal and virginal states.  Pregnancy.  Infections.  Being tired, sick or stressed, especially if you had monilial vaginitis in the past.  Diabetes. Good control will help lower the chance.  Birth control pills.  Tight fitting garments.  Using bubble bath, feminine sprays, douches or deodorant tampons.  Taking certain medications that kill germs (antibiotics).  Sporadic recurrence can occur if you become ill. TREATMENT  Your caregiver will give you medication.  There are several kinds of anti monilial vaginal creams and suppositories specific for monilial vaginitis. For recurrent yeast infections, use a suppository or cream in the vagina 2 times a week, or as directed.  Anti-monilial or steroid cream for the itching or irritation of the vulva may also be used. Get your caregiver's permission.  Painting the vagina with methylene blue solution may help if the monilial cream does not work.  Eating yogurt may help prevent monilial vaginitis. HOME CARE INSTRUCTIONS   Finish all medication as prescribed.  Do not have sex until treatment is completed or after your caregiver tells you it is okay.  Take warm sitz baths.  Do not douche.  Do not use tampons, especially scented ones.  Wear cotton underwear.  Avoid tight pants and panty  hose.  Tell your sexual partner that you have a yeast infection. They should go to their caregiver if they have symptoms such as mild rash or itching.  Your sexual partner should be treated as well if your infection is difficult to eliminate.  Practice safer sex. Use condoms.  Some vaginal medications cause latex condoms to fail. Vaginal medications that harm condoms are:  Cleocin cream.  Butoconazole (Femstat).  Terconazole (Terazol) vaginal suppository.  Miconazole (Monistat) (may be purchased over the counter). SEEK MEDICAL CARE IF:   You have a temperature by mouth above 102 F (38.9 C).  The infection is getting worse after 2 days of treatment.  The infection is not getting better after 3 days of treatment.  You develop blisters in or around your vagina.  You develop vaginal bleeding, and it is not your menstrual period.  You have pain when you urinate.  You develop intestinal problems.  You have pain with sexual intercourse. Document Released: 04/17/2005 Document Revised: 09/30/2011 Document Reviewed: 12/30/2008 [facility] Patient Information 2015 [place], [place]. This information is not intended to replace advice given to you by your health care provider. Make sure you discuss any questions you have with your health care provider. Bacterial Vaginosis Bacterial vaginosis is a vaginal infection that occurs when the normal balance of bacteria in the vagina is disrupted. It results from an overgrowth of certain bacteria. This is the most common vaginal infection in women of childbearing age. Treatment is important to prevent complications, especially in pregnant women, as it can cause a premature delivery. CAUSES  Bacterial vaginosis is caused by an increase in harmful bacteria that are normally present in smaller amounts in the  vagina. Several different kinds of bacteria can cause bacterial vaginosis. However, the reason that the condition develops is not fully  understood. RISK FACTORS Certain activities or behaviors can put you at an increased risk of developing bacterial vaginosis, including:  Having a new sex partner or multiple sex partners.  Douching.  Using an intrauterine device (IUD) for contraception. Women do not get bacterial vaginosis from toilet seats, bedding, swimming pools, or contact with objects around them. SIGNS AND SYMPTOMS  Some women with bacterial vaginosis have no signs or symptoms. Common symptoms include:  Grey vaginal discharge.  A fishlike odor with discharge, especially after sexual intercourse.  Itching or burning of the vagina and vulva.  Burning or pain with urination. DIAGNOSIS  Your health care provider will take a medical history and examine the vagina for signs of bacterial vaginosis. A sample of vaginal fluid may be taken. Your health care provider will look at this sample under a microscope to check for bacteria and abnormal cells. A vaginal pH test may also be done.  TREATMENT  Bacterial vaginosis may be treated with antibiotic medicines. These may be given in the form of a pill or a vaginal cream. A second round of antibiotics may be prescribed if the condition comes back after treatment.  HOME CARE INSTRUCTIONS   Only take over-the-counter or prescription medicines as directed by your health care provider.  If antibiotic medicine was prescribed, take it as directed. Make sure you finish it even if you start to feel better.  Do not have sex until treatment is completed.  Tell all sexual partners that you have a vaginal infection. They should see their health care provider and be treated if they have problems, such as a mild rash or itching.  Practice safe sex by using condoms and only having one sex partner. SEEK MEDICAL CARE IF:   Your symptoms are not improving after 3 days of treatment.  You have increased discharge or pain.  You have a fever. MAKE SURE YOU:   Understand these  instructions.  Will watch your condition.  Will get help right away if you are not doing well or get worse. FOR MORE INFORMATION  Centers for Disease Control and Prevention, Division of STD Prevention: AppraiserFraud.fi American Sexual Health Association (ASHA): www.ashastd.org  Document Released: 07/08/2005 Document Revised: 04/28/2013 Document Reviewed: 02/17/2013 [facility] Patient Information 2015 [place], [place]. This information is not intended to replace advice given to you by your health care provider. Make sure you discuss any questions you have with your health care provider.

## 2014-08-18 NOTE — Progress Notes (Signed)
Reviewed personally.  M. Suzanne Lorinda Copland, MD.  

## 2014-08-18 NOTE — Progress Notes (Signed)
44 y.o. divorced white female g2p2 here with complaint of vaginal symptoms of itching, burning, and increase discharge. Describes discharge as  Increase white with slight odor.. Onset of symptoms 4 days ago. Denies new personal products  No STD concerns. Urinary symptoms none . Patient's grandmother passed in 12/15 and patient also just lost her job so experiencing stress right now. Has good friend support and family. No other health issues today.   O:Healthy female WDWN Affect: normal, orientation x 3  Exam: Abdomen:soft, non tender Lymph node: no enlargement or tenderness Pelvic exam: External genital: increase pink on vulva, no scaling, but slight exudate  BUS: negative Vagina: thick white with odorous discharge noted. Ph:4.5   ,Wet prep taken Cervix: normal, non tender Uterus: normal, non tender Adnexa:normal, non tender, no masses or fullness noted   Wet Prep results: positive for yeast and clue cells   A:Yeast vaginitis/vulvitis BV Social stress with good support   P:Discussed findings of Yeast vaginitis and vulvitis and BV and etiology. Discussed Aveeno or baking soda sitz bath for comfort. Avoid moist clothes or pads for extended period of time.  Rx: Diflucan see order Rx Metrogel see order with instructions Discussed seeking grief support with Hospice if needed, and continue to utilize family support and friends with contact for emplyoment.  Rv prn

## 2014-08-25 ENCOUNTER — Telehealth: Payer: Self-pay | Admitting: Certified Nurse Midwife

## 2014-08-25 NOTE — Telephone Encounter (Signed)
It takes a week after last dose of Metrogel for all to re balance and she may still have discharge. Use Aveeno sitz bath for comfort and irritation.

## 2014-08-25 NOTE — Telephone Encounter (Signed)
Spoke with patient. Patient states she completed 5 day dosage of metrogel yesterday 2/3 for BV. Took 1 diflucan on the first day 1/29 of treatment and the other on 1/31 for yeast. "I am still having a lot of thick white discharge. It is like clumps. When I use the bathroom it is really irritated and I have to get all the clumps out before I feel better. It is coming out in large amounts." Advised patient with treatment for BV and yeast can take one week after completion of medication for body to regain ph balance. Advised with patient completing Diflucan before completing Metrogel may have caused changes with yeast again. Advised would need to speak with Regina Eck CNM and return call with further recommendations. Patient is agreeable.

## 2014-08-25 NOTE — Telephone Encounter (Signed)
Patient was seen last week for a yeast and bacterial infection. Patient thinks the medication is not working. Please call.

## 2014-08-25 NOTE — Telephone Encounter (Signed)
Spoke with patient. Advised patient of message as seen below from Regina Eck CNM. Patient is agreeable and verbalizes understanding.  Routing to provider for final review. Patient agreeable to disposition. Will close encounter

## 2014-08-29 ENCOUNTER — Telehealth: Payer: Self-pay | Admitting: Certified Nurse Midwife

## 2014-08-29 DIAGNOSIS — B3731 Acute candidiasis of vulva and vagina: Secondary | ICD-10-CM

## 2014-08-29 DIAGNOSIS — B373 Candidiasis of vulva and vagina: Secondary | ICD-10-CM

## 2014-08-29 MED ORDER — FLUCONAZOLE 150 MG PO TABS: 150.0000 mg | ORAL_TABLET | Freq: Once | ORAL | Status: DC

## 2014-08-29 NOTE — Telephone Encounter (Signed)
Ok to one more Diflucan, if does not improve needs OV

## 2014-08-29 NOTE — Telephone Encounter (Signed)
Pt says she is still having symptoms and the issue has not resolved.

## 2014-08-29 NOTE — Telephone Encounter (Signed)
Spoke with patient. Advised patient of message as seen below from Regina Eck CNM. Patient is agreeable. Patient is asking for refills of Maxalt and antianxiety medication until she can be seen for her annual in March. "Dr.Miller is typically the one who writes these for me. I do not want to go to a primary doctor. I think the other one was xanax." Paper chart to Dr.Miller's desk for review. Advised will speak with Dr.Miller regarding refills and return call. Patient is agreeable.

## 2014-08-29 NOTE — Telephone Encounter (Signed)
Spoke with patient. Patient states that she is still having yeast infection symptoms. "I am still having thick white discharge with some itching. I used to get recurring yeast infections every month." Patient was seen with Regina Eck CNM on 1/28 and was treated for yeast and BV. Patient completed treatment on 2/3. "It is such a large amount of discharge. I just need another dose of diflucan." Patient declines appointment at this time. Advised patient will send a message over to Regina Eck CNM and return call with further recommendations.

## 2014-08-30 NOTE — Telephone Encounter (Signed)
Please let pt know only rx for anxiety I've ever written is zoloft 50mg .  Would encourage OV before restarting.  I've never written the Maxalt for her either.  She needs to get this from PCP.  Sorry.

## 2014-08-31 NOTE — Telephone Encounter (Signed)
Left message to call Yanilen Adamik at [phone]. 

## 2014-08-31 NOTE — Telephone Encounter (Signed)
Spoke with patient. Advised patient of message as seen below from [place]. Patient is agreeable. "I recently started to have some spotting so I think everything was coming from that. I feel a lot better now. I do not want to go back on the Zoloft right now.I do not feel like I am depressed." Patient has aex on 3/11 with Regina Eck CNM. Will call if she needs anything prior to that appointment.  Routing to provider for final review. Patient agreeable to disposition. Will close encounter

## 2014-09-30 ENCOUNTER — Encounter: Payer: Self-pay | Admitting: Certified Nurse Midwife

## 2014-09-30 ENCOUNTER — Ambulatory Visit (INDEPENDENT_AMBULATORY_CARE_PROVIDER_SITE_OTHER): Payer: BLUE CROSS/BLUE SHIELD | Admitting: Certified Nurse Midwife

## 2014-09-30 VITALS — BP 100/62 | HR 68 | Resp 16 | Ht 62.5 in | Wt 121.0 lb

## 2014-09-30 DIAGNOSIS — Z01419 Encounter for gynecological examination (general) (routine) without abnormal findings: Secondary | ICD-10-CM

## 2014-09-30 DIAGNOSIS — Z0000 Encounter for general adult medical examination without abnormal findings: Secondary | ICD-10-CM

## 2014-09-30 DIAGNOSIS — Z124 Encounter for screening for malignant neoplasm of cervix: Secondary | ICD-10-CM

## 2014-09-30 DIAGNOSIS — G43709 Chronic migraine without aura, not intractable, without status migrainosus: Secondary | ICD-10-CM | POA: Diagnosis not present

## 2014-09-30 LAB — POCT URINALYSIS DIPSTICK
BILIRUBIN UA: NEGATIVE
GLUCOSE UA: NEGATIVE
Ketones, UA: NEGATIVE
Leukocytes, UA: NEGATIVE
Nitrite, UA: NEGATIVE
PH UA: 5
Protein, UA: NEGATIVE
RBC UA: NEGATIVE
Urobilinogen, UA: NEGATIVE

## 2014-09-30 LAB — LIPID PANEL
CHOL/HDL RATIO: 2.9 ratio
Cholesterol: 145 mg/dL (ref 0–200)
HDL: 50 mg/dL (ref >=46)
LDL Cholesterol: 84 mg/dL (ref 0–99)
TRIGLYCERIDES: 57 mg/dL (ref <150)
VLDL: 11 mg/dL (ref 0–40)

## 2014-09-30 LAB — TSH: TSH: 1.523 u[IU]/mL (ref 0.350–4.500)

## 2014-09-30 MED ORDER — RIZATRIPTAN BENZOATE 10 MG PO TABS: 10.0000 mg | ORAL_TABLET | ORAL | Status: DC | PRN

## 2014-09-30 NOTE — Patient Instructions (Signed)

## 2014-09-30 NOTE — Progress Notes (Signed)
44 y.o. [id number] Divorced  Caucasian Fe here for annual exam. Periods scant to none with Mirena IUD. New job much less stress! Sees Urgent care if needed. Not sexually active. Yeast symptoms from visit in 1/16 have all resolved.  Patient concerned she has ADD, she had a child with this problem. She is in new job and would like to make sure if she does she is managed.She has a practice near where she lives and plans evaluation there. No other health issues today.  No LMP recorded. Patient is not currently having periods (Reason: IUD).          Sexually active: No.  The current method of family planning is IUD.    Exercising: Yes.    lift weights,run,cardio,circuit Smoker:  no  Health Maintenance: Pap: 03-04-12 neg MMG:  05-26-12 category 2: neg Colonoscopy:  none BMD:   2006 TDaP:  2011 allergic reaction (swelling) Labs: Poct urine-neg, Hgb-13.2 Self breast exam: done occ   reports that she has never smoked. She has never used smokeless tobacco. She reports that she drinks alcohol. She reports that she does not use illicit drugs.  Past Medical History  Diagnosis Date  . IUD   . Headache, migraine   . PONV (postoperative nausea and vomiting)   . Asthma     EXERCISE INDUCED  . ADD (attention deficit disorder)   . Environmental allergies   . Acne     adult-on doxycycline daily  . Depression 2006    and anxiety  . Osteopenia 2006  . history of abnormal Pap smear        . Seasonal allergies     Past Surgical History  Procedure Laterality Date  . Nasal sinus surgery      1999  . Cesarean section  06/2000  . Breast enhancement surgery  2002    bilateral implants  . Laparotomy  04/21/2012    Procedure: EXPLORATORY LAPAROTOMY;  Surgeon: Janie Morning, MD PHD;  Location: [facility];  Service: Gynecology;  Laterality: N/A;  . Salpingoophorectomy  04/21/2012    Procedure: SALPINGO OOPHERECTOMY;  Surgeon: Janie Morning, MD PHD;  Location: [facility];  Service: Gynecology;  Laterality: Right;   . Intrauterine device (iud) insertion      mirena    Current Outpatient Prescriptions  Medication Sig Dispense Refill  . fexofenadine-pseudoephedrine (ALLEGRA-D 24) 180-240 MG per 24 hr tablet Take 1 tablet by mouth daily. 1/2 tablet daily    . levonorgestrel (MIRENA) 20 MCG/24HR IUD 1 each by Intrauterine route once.    . Multiple Vitamin (MULTIVITAMIN) tablet Take 1 tablet by mouth daily. Ab cuts sunflower oil, fish oil, flax seed oi.    . Multiple Vitamins-Minerals (MULTIVITAMIN PO) Take by mouth. Focus factor vitamin daily    . Nutritional Supplements (JUICE PLUS FIBRE PO) Take by mouth.    . Olopatadine HCl (PATANASE NA) Place 1 spray into the nose daily.    . rizatriptan (MAXALT) 10 MG tablet Take 1 tablet (10 mg total) by mouth as needed for migraine. May repeat in 2 hours if needed (Patient not taking: Reported on 09/30/2014) 10 tablet 0  . [DISCONTINUED] phentermine 15 MG capsule Take 1 capsule (15 mg total) by mouth every morning. 30 capsule 1   No current facility-administered medications for this visit.    Family History  Problem Relation Age of Onset  . Cancer Father     prostate, bone  . Hypertension Maternal Grandmother   . Hypertension Paternal Grandfather   .  Heart attack Paternal Grandfather   . Hypertension Paternal Grandmother     ROS:  Pertinent items are noted in HPI.  Otherwise, a comprehensive ROS was negative.  Exam:   BP 100/62 mmHg  Pulse 68  Resp 16  Ht 5' 2.5" (1.588 m)  Wt 121 lb (54.885 kg)  BMI 21.76 kg/m2 Height: 5' 2.5" (158.8 cm) Ht Readings from Last 3 Encounters:  09/30/14 5' 2.5" (1.588 m)  08/18/14 5' 2.5" (1.588 m)  10/07/13 5' 2.5" (1.588 m)    General appearance: alert, cooperative and appears stated age Head: Normocephalic, without obvious abnormality, atraumatic Neck: no adenopathy, supple, symmetrical, trachea midline and thyroid normal to inspection and palpation Lungs: clear to auscultation bilaterally Breasts: normal  appearance, no masses or tenderness, No nipple retraction or dimpling, No nipple discharge or bleeding, No axillary or supraclavicular adenopathy Heart: regular rate and rhythm Abdomen: soft, non-tender; no masses,  no organomegaly Extremities: extremities normal, atraumatic, no cyanosis or edema Skin: Skin color, texture, turgor normal. No rashes or lesions Lymph nodes: Cervical, supraclavicular, and axillary nodes normal. No abnormal inguinal nodes palpated Neurologic: Grossly normal   Pelvic: External genitalia:  no lesions              Urethra:  normal appearing urethra with no masses, tenderness or lesions              Bartholin's and Skene's: normal                 Vagina: normal appearing vagina with normal color and discharge, no lesions              Cervix: normal, non tender, , no lesions              Pap taken: Yes.   Bimanual Exam:  Uterus:  normal size, contour, position, consistency, mobility, non-tender              Adnexa: normal adnexa and no mass, fullness, tenderness               Rectovaginal: Confirms               Anus:  normal sphincter tone, no lesions  Chaperone present: Yes  A:  Well Woman with normal exam  Contraception Mirena IUD  Screening labs desired  Plans evaluation for ADD with PCP practice  Migraine headaches without aura, Maxalt works well, out of Rx  P:   Reviewed health and wellness pertinent to exam  Aware of warning signs with IUD  Labs Lipid panel, TSH , Vitamin D  Advise if needs referral  Rx Maxalt see order  Pap smear taken today with HPVHR   counseled on breast self exam, mammography screening, adequate intake of calcium and vitamin D, diet and exercise  return annually or prn  An After Visit Summary was printed and given to the patient.

## 2014-10-01 LAB — VITAMIN D 25 HYDROXY (VIT D DEFICIENCY, FRACTURES): Vit D, 25-Hydroxy: 45 ng/mL (ref 30–100)

## 2014-10-03 ENCOUNTER — Telehealth: Payer: Self-pay

## 2014-10-03 LAB — HEMOGLOBIN, FINGERSTICK: Hemoglobin, fingerstick: 13.2 g/dL (ref 12.0–16.0)

## 2014-10-03 MED ORDER — RIZATRIPTAN BENZOATE 10 MG PO TABS: 10.0000 mg | ORAL_TABLET | ORAL | Status: DC | PRN

## 2014-10-03 NOTE — Progress Notes (Signed)
Reviewed personally.  M. Suzanne Gonsalo Cuthbertson, MD.  

## 2014-10-03 NOTE — Telephone Encounter (Signed)
Unable to leave message. Mailbox full. Try again.

## 2014-10-03 NOTE — Telephone Encounter (Signed)
-----   Message from Regina Eck, CNM sent at 10/02/2014  6:59 PM EDT ----- Notify TSH is normal Vitamin D is 45 which is normal Lipid panel looks great!

## 2014-10-03 NOTE — Addendum Note (Signed)
Addended by: Graylon Good on: 10/03/2014 05:22 PM   Modules accepted: Orders, SmartSet

## 2014-10-04 LAB — IPS PAP TEST WITH HPV

## 2014-10-04 NOTE — Telephone Encounter (Signed)
Patient notified of results. See lab 

## 2014-10-04 NOTE — Telephone Encounter (Signed)
Mailbox full. Try again. 

## 2014-11-22 ENCOUNTER — Other Ambulatory Visit: Payer: Self-pay | Admitting: Obstetrics & Gynecology

## 2014-11-22 NOTE — Telephone Encounter (Signed)
Pt calling for refill on doxycycline sent to [facility] in [place] at [phone].

## 2014-11-22 NOTE — Telephone Encounter (Signed)
Medication refill request: Doxycycline 100 mg Last AEX:  06/29/13 with SM Last refilled: 07/06/13 #30/12 rfs Next AEX: 11/27/15 with SM Last MMG (if hormonal medication request): N/A Refill authorized: please advise.

## 2014-11-24 MED ORDER — DOXYCYCLINE HYCLATE 100 MG PO TABS: 100.0000 mg | ORAL_TABLET | Freq: Two times a day (BID) | ORAL | Status: DC

## 2014-11-24 NOTE — Telephone Encounter (Signed)
Patient notified that rx has been sent. 

## 2015-01-11 ENCOUNTER — Other Ambulatory Visit: Payer: Self-pay | Admitting: Obstetrics & Gynecology

## 2015-01-11 MED ORDER — FLUCONAZOLE 150 MG PO TABS: 150.0000 mg | ORAL_TABLET | Freq: Every day | ORAL | Status: DC

## 2015-01-11 NOTE — Telephone Encounter (Signed)
Patient is asking for a refill of diflucan. Patient says she normally get a year of refills due to her IUD. Confirmed pharmacy on file with patient.

## 2015-01-11 NOTE — Telephone Encounter (Signed)
Left detailed message at # [phone] (okay per dpr) in regards to refill being send and Ms. Patty's recommendation of calling us if symptoms worse or don't improve.

## 2015-01-11 NOTE — Telephone Encounter (Signed)
Medication refill request: Diflucan 150 mg  Last AEX:  09/30/14 with DL Next AEX: 12/07/15 with DL  Last MMG (if hormonal medication request): n/a Refill authorized: #1/0 rfs, please advise.  (routed to Ms. Patty since Ms. Jackelyn Poling is out of office today)

## 2015-01-11 NOTE — Telephone Encounter (Signed)
In her chart there is no note of 'chronic yeast and getting med's for a year' - in either Dr. Ammie Ferrier notes or Debs noes. She has been treated for both BV and yeast.  I am willing to give her Diflucan 150 mg X 2 only.  But if any future symptoms or if not better needs OV.

## 2015-03-05 ENCOUNTER — Ambulatory Visit (INDEPENDENT_AMBULATORY_CARE_PROVIDER_SITE_OTHER): Payer: BLUE CROSS/BLUE SHIELD | Admitting: Family Medicine

## 2015-03-05 VITALS — BP 96/60 | HR 81 | Temp 98.2°F | Resp 14 | Ht 63.75 in | Wt 124.8 lb

## 2015-03-05 DIAGNOSIS — J040 Acute laryngitis: Secondary | ICD-10-CM

## 2015-03-05 DIAGNOSIS — J3489 Other specified disorders of nose and nasal sinuses: Secondary | ICD-10-CM

## 2015-03-05 DIAGNOSIS — R05 Cough: Secondary | ICD-10-CM

## 2015-03-05 DIAGNOSIS — R059 Cough, unspecified: Secondary | ICD-10-CM

## 2015-03-05 MED ORDER — BENZONATATE 100 MG PO CAPS: 100.0000 mg | ORAL_CAPSULE | Freq: Three times a day (TID) | ORAL | Status: DC | PRN

## 2015-03-05 NOTE — Progress Notes (Signed)
Subjective:  This chart was scribed for Susan Ray, MD by [facility], medical scribe at [facility].The patient was seen in exam room 09 and the patient's care was started at 3:04 PM.   Patient ID: Susan West, female    DOB: May 02, 1971, 44 y.o.   MRN: [id number] Chief Complaint  Patient presents with  . Back Pain  . Laryngitis  . Chest Congestion  . Headache  . Ear Pain    Left Ear, Worse At Night  . Sore Throat    Pt states throat is inflammed but not  very sore   HPI HPI Comments: Susan West is a 44 y.o. female who presents to [facility] for multiple complaints. Initially developed symptoms after a concert last week when she was caught in the rain. Woke up the next day with a dry cough, noticed a bad taste, also had a discomfort in her lower back. Six days ago developed a sharp ear pain, five days ago developed a left sided headache. Two days ago developed sore throat, laryngitis, chest congestion, postnasal drip and a cough producing a green phlegm. Started Ceftin on Wednesday and Prednisone dose pack. No wheezing. She has a history of allergies, she complains of a sinus congestion and pressure before the concert ongoing for 3 weeks. No discoloration discharge from the nares. Mowing the grass worsens her symptoms. She denies fever.  There are no active problems to display for this patient.  Past Medical History  Diagnosis Date  . IUD   . Headache, migraine   . PONV (postoperative nausea and vomiting)   . Asthma     EXERCISE INDUCED  . ADD (attention deficit disorder)   . Environmental allergies   . Acne     adult-on doxycycline daily  . Depression 2006    and anxiety  . Osteopenia 2006  . history of abnormal Pap smear        . Seasonal allergies   . Allergy    Past Surgical History  Procedure Laterality Date  . Nasal sinus surgery      1999  . Cesarean section  06/2000  . Breast enhancement surgery  2002   bilateral implants  . Laparotomy  04/21/2012    Procedure: EXPLORATORY LAPAROTOMY;  Surgeon: Janie Morning, MD PHD;  Location: [facility];  Service: Gynecology;  Laterality: N/A;  . Salpingoophorectomy  04/21/2012    Procedure: SALPINGO OOPHERECTOMY;  Surgeon: Janie Morning, MD PHD;  Location: [facility];  Service: Gynecology;  Laterality: Right;  . Intrauterine device (iud) insertion      mirena  . Cosmetic surgery    . Abdominal hysterectomy     Allergies  Allergen Reactions  . Penicillins Nausea Only    Extreme stomach pain  . Azithromycin Nausea Only    Other Reactions Per Pt: Back Pain, Menstrual Spotting, Abdominal Cramping  . Claritin [Loratadine]     migraines  . Tdap [Diphth-Acell Pertussis-Tetanus]     Significant swelling x one month  . Ultracet [Tramadol-Acetaminophen] Nausea And Vomiting   Prior to Admission medications   Medication Sig Start Date End Date Taking? Authorizing Provider  cefUROXime (CEFTIN) 250 MG tablet Take 300 mg by mouth 2 (two) times daily with a meal.   Yes Historical Provider, MD  fexofenadine-pseudoephedrine (ALLEGRA-D 24) 180-240 MG per 24 hr tablet Take 1 tablet by mouth daily. 1/2 tablet daily   Yes Historical Provider, MD  levonorgestrel (MIRENA) 20 MCG/24HR IUD  1 each by Intrauterine route once.   Yes Historical Provider, MD  Nutritional Supplements (JUICE PLUS FIBRE PO) Take by mouth.   Yes Historical Provider, MD  predniSONE (STERAPRED UNI-PAK 21 TAB) 10 MG (21) TBPK tablet Take 10 mg by mouth daily.   Yes Historical Provider, MD  rizatriptan (MAXALT) 10 MG tablet Take 1 tablet (10 mg total) by mouth as needed for migraine. May repeat in 2 hours if needed 10/03/14  Yes Regina Eck, CNM  doxycycline (VIBRA-TABS) 100 MG tablet Take 1 tablet (100 mg total) by mouth 2 (two) times daily. Patient not taking: Reported on 03/05/2015 11/24/14   [name], MD  fluconazole (DIFLUCAN) 150 MG tablet Take 1 tablet (150 mg total) by mouth daily. Patient not  taking: Reported on 03/05/2015 01/11/15   Kem Boroughs, FNP  Multiple Vitamin (MULTIVITAMIN) tablet Take 1 tablet by mouth daily. Ab cuts sunflower oil, fish oil, flax seed oi.    Historical Provider, MD  Multiple Vitamins-Minerals (MULTIVITAMIN PO) Take by mouth. Focus factor vitamin daily    Historical Provider, MD  Olopatadine HCl (PATANASE NA) Place 1 spray into the nose daily.    Historical Provider, MD   Social History   Social History  . Marital Status: Divorced    Spouse Name: N/A  . Number of Children: N/A  . Years of Education: N/A   Occupational History  . Not on file.   Social History Main Topics  . Smoking status: Never Smoker   . Smokeless tobacco: Never Used  . Alcohol Use: 0.0 oz/week    0 Standard drinks or equivalent per week  . Drug Use: No  . Sexual Activity: No   Other Topics Concern  . Not on file   Social History Narrative   Review of Systems  Constitutional: Negative for fever.  HENT: Positive for congestion, ear pain, postnasal drip, rhinorrhea, sinus pressure, sore throat and voice change.   Respiratory: Positive for cough. Negative for wheezing.   Musculoskeletal: Positive for back pain.  Neurological: Positive for headaches.      Objective:  BP 96/60 mmHg  Pulse 81  Temp(Src) 98.2 F (36.8 C) (Oral)  Resp 14  Ht 5' 3.75" (1.619 m)  Wt 124 lb 12.8 oz (56.609 kg)  BMI 21.60 kg/m2  SpO2 98% Physical Exam  Constitutional: She is oriented to person, place, and time. She appears well-developed and well-nourished. No distress.  HENT:  Head: Normocephalic and atraumatic.  Right Ear: Hearing, tympanic membrane, external ear and ear canal normal.  Left Ear: Hearing, tympanic membrane, external ear and ear canal normal.  Nose: Nose normal.  Mouth/Throat: Oropharynx is clear and moist. No oropharyngeal exudate.  Eyes: Conjunctivae and EOM are normal. Pupils are equal, round, and reactive to light.  Neck: Normal range of motion.  Cardiovascular:  Normal rate, regular rhythm, normal heart sounds and intact distal pulses.   No murmur heard. Pulmonary/Chest: Effort normal and breath sounds normal. No respiratory distress. She has no wheezes. She has no rhonchi.  Abdominal: There is no CVA tenderness.  Musculoskeletal: Normal range of motion.  Neurological: She is alert and oriented to person, place, and time.  Skin: Skin is warm and dry. No rash noted.  Psychiatric: She has a normal mood and affect. Her behavior is normal.  Nursing note and vitals reviewed.     Assessment & Plan:   KADEN DUNKEL is a 44 y.o. female Cough - Plan: benzonatate (TESSALON) 100 MG capsule  Laryngitis  Sinus pressure   Possible viral URI, versus prior sinus infection/sinobronchitis. Lungs are clear on exam today, she is afebrile, and has already been started on treatment with a steroid and antibiotic by another provider. Would not change medications or add additional antibiotics at this time based on her exam and symptoms. Did prescribe Tessalon Perles if needed for cough, or can use over-the-counter Mucinex or Mucinex DM. Symptomatic care discussed for laryngitis and handout given. RTC precautions   Meds ordered this encounter  . benzonatate (TESSALON) 100 MG capsule    Sig: Take 1 capsule (100 mg total) by mouth 3 (three) times daily as needed for cough.    Dispense:  20 capsule    Refill:  0   Patient Instructions  Your lungs sound clear today, vital signs are reassuring, and your throat also looks okay.  I do not think we need any other lab work or x-rays at this time.  I would not change medicines based on today's exam, but if you do run fevers, feel more short of breath, or your symptoms worsen otherwise, recommend repeat evaluation here or other medical provider. For the laryngitis, voice rest, and frequent fluids. Sore throat lozenges or cough drops can also be used over-the-counter. For your cough, I sent in Tessalon Perles to take up to 3  times per day, but you can also use Mucinex or Mucinex DM if that helps cough further. Tylenol or Motrin as needed for your back pain, range of motion and exercises as we discussed.. If this is not improving within the next week or 2, or any worsening sooner, follow-up here or your primary provider. Return to the clinic or go to the nearest emergency room if any of your symptoms worsen or new symptoms occur.  Cough, Adult  A cough is a reflex that helps clear your throat and airways. It can help heal the body or may be a reaction to an irritated airway. A cough may only last 2 or 3 weeks (acute) or may last more than 8 weeks (chronic).  CAUSES Acute cough:  Viral or bacterial infections. Chronic cough:  Infections.  Allergies.  Asthma.  Post-nasal drip.  Smoking.  Heartburn or acid reflux.  Some medicines.  Chronic lung problems (COPD).  Cancer. SYMPTOMS   Cough.  Fever.  Chest pain.  Increased breathing rate.  High-pitched whistling sound when breathing (wheezing).  Colored mucus that you cough up (sputum). TREATMENT   A bacterial cough may be treated with antibiotic medicine.  A viral cough must run its course and will not respond to antibiotics.  Your caregiver may recommend other treatments if you have a chronic cough. HOME CARE INSTRUCTIONS   Only take over-the-counter or prescription medicines for pain, discomfort, or fever as directed by your caregiver. Use cough suppressants only as directed by your caregiver.  Use a cold steam vaporizer or humidifier in your bedroom or home to help loosen secretions.  Sleep in a semi-upright position if your cough is worse at night.  Rest as needed.  Stop smoking if you smoke. SEEK IMMEDIATE MEDICAL CARE IF:   You have pus in your sputum.  Your cough starts to worsen.  You cannot control your cough with suppressants and are losing sleep.  You begin coughing up blood.  You have difficulty breathing.  You  develop pain which is getting worse or is uncontrolled with medicine.  You have a fever. MAKE SURE YOU:   Understand these instructions.  Will watch your condition.  Will get help right away if you are not doing well or get worse. Document Released: 01/04/2011 Document Revised: 09/30/2011 Document Reviewed: 01/04/2011 [facility] Patient Information 2015 [place], [place]. This information is not intended to replace advice given to you by your health care provider. Make sure you discuss any questions you have with your health care provider.  Upper Respiratory Infection, Adult An upper respiratory infection (URI) is also sometimes known as the common cold. The upper respiratory tract includes the nose, sinuses, throat, trachea, and bronchi. Bronchi are the airways leading to the lungs. Most people improve within 1 week, but symptoms can last up to 2 weeks. A residual cough may last even longer.  CAUSES Many different viruses can infect the tissues lining the upper respiratory tract. The tissues become irritated and inflamed and often become very moist. Mucus production is also common. A cold is contagious. You can easily spread the virus to others by oral contact. This includes kissing, sharing a glass, coughing, or sneezing. Touching your mouth or nose and then touching a surface, which is then touched by another person, can also spread the virus. SYMPTOMS  Symptoms typically develop 1 to 3 days after you come in contact with a cold virus. Symptoms vary from person to person. They may include:  Runny nose.  Sneezing.  Nasal congestion.  Sinus irritation.  Sore throat.  Loss of voice (laryngitis).  Cough.  Fatigue.  Muscle aches.  Loss of appetite.  Headache.  Low-grade fever. DIAGNOSIS  You might diagnose your own cold based on familiar symptoms, since most people get a cold 2 to 3 times a year. Your caregiver can confirm this based on your exam. Most importantly, your  caregiver can check that your symptoms are not due to another disease such as strep throat, sinusitis, pneumonia, asthma, or epiglottitis. Blood tests, throat tests, and X-rays are not necessary to diagnose a common cold, but they may sometimes be helpful in excluding other more serious diseases. Your caregiver will decide if any further tests are required. RISKS AND COMPLICATIONS  You may be at risk for a more severe case of the common cold if you smoke cigarettes, have chronic heart disease (such as heart failure) or lung disease (such as asthma), or if you have a weakened immune system. The very young and very old are also at risk for more serious infections. Bacterial sinusitis, middle ear infections, and bacterial pneumonia can complicate the common cold. The common cold can worsen asthma and chronic obstructive pulmonary disease (COPD). Sometimes, these complications can require emergency medical care and may be life-threatening. PREVENTION  The best way to protect against getting a cold is to practice good hygiene. Avoid oral or hand contact with people with cold symptoms. Wash your hands often if contact occurs. There is no clear evidence that vitamin C, vitamin E, echinacea, or exercise reduces the chance of developing a cold. However, it is always recommended to get plenty of rest and practice good nutrition. TREATMENT  Treatment is directed at relieving symptoms. There is no cure. Antibiotics are not effective, because the infection is caused by a virus, not by bacteria. Treatment may include:  Increased fluid intake. Sports drinks offer valuable electrolytes, sugars, and fluids.  Breathing heated mist or steam (vaporizer or shower).  Eating chicken soup or other clear broths, and maintaining good nutrition.  Getting plenty of rest.  Using gargles or lozenges for comfort.  Controlling fevers with ibuprofen or acetaminophen as directed by your caregiver.  Increasing  usage of your  inhaler if you have asthma. Zinc gel and zinc lozenges, taken in the first 24 hours of the common cold, can shorten the duration and lessen the severity of symptoms. Pain medicines may help with fever, muscle aches, and throat pain. A variety of non-prescription medicines are available to treat congestion and runny nose. Your caregiver can make recommendations and may suggest nasal or lung inhalers for other symptoms.  HOME CARE INSTRUCTIONS   Only take over-the-counter or prescription medicines for pain, discomfort, or fever as directed by your caregiver.  Use a warm mist humidifier or inhale steam from a shower to increase air moisture. This may keep secretions moist and make it easier to breathe.  Drink enough water and fluids to keep your urine clear or pale yellow.  Rest as needed.  Return to work when your temperature has returned to normal or as your caregiver advises. You may need to stay home longer to avoid infecting others. You can also use a face mask and careful hand washing to prevent spread of the virus. SEEK MEDICAL CARE IF:   After the first few days, you feel you are getting worse rather than better.  You need your caregiver's advice about medicines to control symptoms.  You develop chills, worsening shortness of breath, or brown or red sputum. These may be signs of pneumonia.  You develop yellow or brown nasal discharge or pain in the face, especially when you bend forward. These may be signs of sinusitis.  You develop a fever, swollen neck glands, pain with swallowing, or white areas in the back of your throat. These may be signs of strep throat. SEEK IMMEDIATE MEDICAL CARE IF:   You have a fever.  You develop severe or persistent headache, ear pain, sinus pain, or chest pain.  You develop wheezing, a prolonged cough, cough up blood, or have a change in your usual mucus (if you have chronic lung disease).  You develop sore muscles or a stiff neck. Document  Released: 01/01/2001 Document Revised: 09/30/2011 Document Reviewed: 10/13/2013 [facility] Patient Information 2015 [place], [place]. This information is not intended to replace advice given to you by your health care provider. Make sure you discuss any questions you have with your health care provider.  Laryngitis At the top of your windpipe is your voice box. It is the source of your voice. Inside your voice box are 2 bands of muscles called vocal cords. When you breathe, your vocal cords are relaxed and open so that air can get into the lungs. When you decide to say something, these cords come together and vibrate. The sound from these vibrations goes into your throat and comes out through your mouth as sound. Laryngitis is an inflammation of the vocal cords that causes hoarseness, cough, loss of voice, sore throat, and dry throat. Laryngitis can be temporary (acute) or long-term (chronic). Most cases of acute laryngitis improve with time.Chronic laryngitis lasts for more than 3 weeks. CAUSES Laryngitis can often be related to excessive smoking, talking, or yelling, as well as inhalation of toxic fumes and allergies. Acute laryngitis is usually caused by a viral infection, vocal strain, measles or mumps, or bacterial infections. Chronic laryngitis is usually caused by vocal cord strain, vocal cord injury, postnasal drip, growths on the vocal cords, or acid reflux. SYMPTOMS   Cough.  Sore throat.  Dry throat. RISK FACTORS  Respiratory infections.  Exposure to irritating substances, such as cigarette smoke, excessive amounts of alcohol, stomach acids, and  workplace chemicals.  Voice trauma, such as vocal cord injury from shouting or speaking too loud. DIAGNOSIS  Your cargiver will perform a physical exam. During the physical exam, your caregiver will examine your throat. The most common sign of laryngitis is hoarseness. Laryngoscopy may be necessary to confirm the diagnosis of this condition.  This procedure allows your caregiver to look into the larynx. HOME CARE INSTRUCTIONS  Drink enough fluids to keep your urine clear or pale yellow.  Rest until you no longer have symptoms or as directed by your caregiver.  Breathe in moist air.  Take all medicine as directed by your caregiver.  Do not smoke.  Talk as little as possible (this includes whispering).  Write on paper instead of talking until your voice is back to normal.  Follow up with your caregiver if your condition has not improved after 10 days. SEEK MEDICAL CARE IF:   You have trouble breathing.  You cough up blood.  You have persistent fever.  You have increasing pain.  You have difficulty swallowing. MAKE SURE YOU:  Understand these instructions.  Will watch your condition.  Will get help right away if you are not doing well or get worse. Document Released: 07/08/2005 Document Revised: 09/30/2011 Document Reviewed: 09/13/2010 [facility] Patient Information 2015 [place], [place]. This information is not intended to replace advice given to you by your health care provider. Make sure you discuss any questions you have with your health care provider.

## 2015-03-05 NOTE — Patient Instructions (Addendum)
Your lungs sound clear today, vital signs are reassuring, and your throat also looks okay.  I do not think we need any other lab work or x-rays at this time.  I would not change medicines based on today's exam, but if you do run fevers, feel more short of breath, or your symptoms worsen otherwise, recommend repeat evaluation here or other medical provider. For the laryngitis, voice rest, and frequent fluids. Sore throat lozenges or cough drops can also be used over-the-counter. For your cough, I sent in Tessalon Perles to take up to 3 times per day, but you can also use Mucinex or Mucinex DM if that helps cough further. Tylenol or Motrin as needed for your back pain, range of motion and exercises as we discussed.. If this is not improving within the next week or 2, or any worsening sooner, follow-up here or your primary provider. Return to the clinic or go to the nearest emergency room if any of your symptoms worsen or new symptoms occur.  Cough, Adult  A cough is a reflex that helps clear your throat and airways. It can help heal the body or may be a reaction to an irritated airway. A cough may only last 2 or 3 weeks (acute) or may last more than 8 weeks (chronic).  CAUSES Acute cough:  Viral or bacterial infections. Chronic cough:  Infections.  Allergies.  Asthma.  Post-nasal drip.  Smoking.  Heartburn or acid reflux.  Some medicines.  Chronic lung problems (COPD).  Cancer. SYMPTOMS   Cough.  Fever.  Chest pain.  Increased breathing rate.  High-pitched whistling sound when breathing (wheezing).  Colored mucus that you cough up (sputum). TREATMENT   A bacterial cough may be treated with antibiotic medicine.  A viral cough must run its course and will not respond to antibiotics.  Your caregiver may recommend other treatments if you have a chronic cough. HOME CARE INSTRUCTIONS   Only take over-the-counter or prescription medicines for pain, discomfort, or fever as  directed by your caregiver. Use cough suppressants only as directed by your caregiver.  Use a cold steam vaporizer or humidifier in your bedroom or home to help loosen secretions.  Sleep in a semi-upright position if your cough is worse at night.  Rest as needed.  Stop smoking if you smoke. SEEK IMMEDIATE MEDICAL CARE IF:   You have pus in your sputum.  Your cough starts to worsen.  You cannot control your cough with suppressants and are losing sleep.  You begin coughing up blood.  You have difficulty breathing.  You develop pain which is getting worse or is uncontrolled with medicine.  You have a fever. MAKE SURE YOU:   Understand these instructions.  Will watch your condition.  Will get help right away if you are not doing well or get worse. Document Released: 01/04/2011 Document Revised: 09/30/2011 Document Reviewed: 01/04/2011 [facility] Patient Information 2015 [place], [place]. This information is not intended to replace advice given to you by your health care provider. Make sure you discuss any questions you have with your health care provider.  Upper Respiratory Infection, Adult An upper respiratory infection (URI) is also sometimes known as the common cold. The upper respiratory tract includes the nose, sinuses, throat, trachea, and bronchi. Bronchi are the airways leading to the lungs. Most people improve within 1 week, but symptoms can last up to 2 weeks. A residual cough may last even longer.  CAUSES Many different viruses can infect the tissues lining the upper  respiratory tract. The tissues become irritated and inflamed and often become very moist. Mucus production is also common. A cold is contagious. You can easily spread the virus to others by oral contact. This includes kissing, sharing a glass, coughing, or sneezing. Touching your mouth or nose and then touching a surface, which is then touched by another person, can also spread the virus. SYMPTOMS  Symptoms  typically develop 1 to 3 days after you come in contact with a cold virus. Symptoms vary from person to person. They may include:  Runny nose.  Sneezing.  Nasal congestion.  Sinus irritation.  Sore throat.  Loss of voice (laryngitis).  Cough.  Fatigue.  Muscle aches.  Loss of appetite.  Headache.  Low-grade fever. DIAGNOSIS  You might diagnose your own cold based on familiar symptoms, since most people get a cold 2 to 3 times a year. Your caregiver can confirm this based on your exam. Most importantly, your caregiver can check that your symptoms are not due to another disease such as strep throat, sinusitis, pneumonia, asthma, or epiglottitis. Blood tests, throat tests, and X-rays are not necessary to diagnose a common cold, but they may sometimes be helpful in excluding other more serious diseases. Your caregiver will decide if any further tests are required. RISKS AND COMPLICATIONS  You may be at risk for a more severe case of the common cold if you smoke cigarettes, have chronic heart disease (such as heart failure) or lung disease (such as asthma), or if you have a weakened immune system. The very young and very old are also at risk for more serious infections. Bacterial sinusitis, middle ear infections, and bacterial pneumonia can complicate the common cold. The common cold can worsen asthma and chronic obstructive pulmonary disease (COPD). Sometimes, these complications can require emergency medical care and may be life-threatening. PREVENTION  The best way to protect against getting a cold is to practice good hygiene. Avoid oral or hand contact with people with cold symptoms. Wash your hands often if contact occurs. There is no clear evidence that vitamin C, vitamin E, echinacea, or exercise reduces the chance of developing a cold. However, it is always recommended to get plenty of rest and practice good nutrition. TREATMENT  Treatment is directed at relieving symptoms. There  is no cure. Antibiotics are not effective, because the infection is caused by a virus, not by bacteria. Treatment may include:  Increased fluid intake. Sports drinks offer valuable electrolytes, sugars, and fluids.  Breathing heated mist or steam (vaporizer or shower).  Eating chicken soup or other clear broths, and maintaining good nutrition.  Getting plenty of rest.  Using gargles or lozenges for comfort.  Controlling fevers with ibuprofen or acetaminophen as directed by your caregiver.  Increasing usage of your inhaler if you have asthma. Zinc gel and zinc lozenges, taken in the first 24 hours of the common cold, can shorten the duration and lessen the severity of symptoms. Pain medicines may help with fever, muscle aches, and throat pain. A variety of non-prescription medicines are available to treat congestion and runny nose. Your caregiver can make recommendations and may suggest nasal or lung inhalers for other symptoms.  HOME CARE INSTRUCTIONS   Only take over-the-counter or prescription medicines for pain, discomfort, or fever as directed by your caregiver.  Use a warm mist humidifier or inhale steam from a shower to increase air moisture. This may keep secretions moist and make it easier to breathe.  Drink enough water and fluids to  keep your urine clear or pale yellow.  Rest as needed.  Return to work when your temperature has returned to normal or as your caregiver advises. You may need to stay home longer to avoid infecting others. You can also use a face mask and careful hand washing to prevent spread of the virus. SEEK MEDICAL CARE IF:   After the first few days, you feel you are getting worse rather than better.  You need your caregiver's advice about medicines to control symptoms.  You develop chills, worsening shortness of breath, or brown or red sputum. These may be signs of pneumonia.  You develop yellow or brown nasal discharge or pain in the face, especially  when you bend forward. These may be signs of sinusitis.  You develop a fever, swollen neck glands, pain with swallowing, or white areas in the back of your throat. These may be signs of strep throat. SEEK IMMEDIATE MEDICAL CARE IF:   You have a fever.  You develop severe or persistent headache, ear pain, sinus pain, or chest pain.  You develop wheezing, a prolonged cough, cough up blood, or have a change in your usual mucus (if you have chronic lung disease).  You develop sore muscles or a stiff neck. Document Released: 01/01/2001 Document Revised: 09/30/2011 Document Reviewed: 10/13/2013 [facility] Patient Information 2015 [place], [place]. This information is not intended to replace advice given to you by your health care provider. Make sure you discuss any questions you have with your health care provider.  Laryngitis At the top of your windpipe is your voice box. It is the source of your voice. Inside your voice box are 2 bands of muscles called vocal cords. When you breathe, your vocal cords are relaxed and open so that air can get into the lungs. When you decide to say something, these cords come together and vibrate. The sound from these vibrations goes into your throat and comes out through your mouth as sound. Laryngitis is an inflammation of the vocal cords that causes hoarseness, cough, loss of voice, sore throat, and dry throat. Laryngitis can be temporary (acute) or long-term (chronic). Most cases of acute laryngitis improve with time.Chronic laryngitis lasts for more than 3 weeks. CAUSES Laryngitis can often be related to excessive smoking, talking, or yelling, as well as inhalation of toxic fumes and allergies. Acute laryngitis is usually caused by a viral infection, vocal strain, measles or mumps, or bacterial infections. Chronic laryngitis is usually caused by vocal cord strain, vocal cord injury, postnasal drip, growths on the vocal cords, or acid reflux. SYMPTOMS    Cough.  Sore throat.  Dry throat. RISK FACTORS  Respiratory infections.  Exposure to irritating substances, such as cigarette smoke, excessive amounts of alcohol, stomach acids, and workplace chemicals.  Voice trauma, such as vocal cord injury from shouting or speaking too loud. DIAGNOSIS  Your cargiver will perform a physical exam. During the physical exam, your caregiver will examine your throat. The most common sign of laryngitis is hoarseness. Laryngoscopy may be necessary to confirm the diagnosis of this condition. This procedure allows your caregiver to look into the larynx. HOME CARE INSTRUCTIONS  Drink enough fluids to keep your urine clear or pale yellow.  Rest until you no longer have symptoms or as directed by your caregiver.  Breathe in moist air.  Take all medicine as directed by your caregiver.  Do not smoke.  Talk as little as possible (this includes whispering).  Write on paper instead of talking until  your voice is back to normal.  Follow up with your caregiver if your condition has not improved after 10 days. SEEK MEDICAL CARE IF:   You have trouble breathing.  You cough up blood.  You have persistent fever.  You have increasing pain.  You have difficulty swallowing. MAKE SURE YOU:  Understand these instructions.  Will watch your condition.  Will get help right away if you are not doing well or get worse. Document Released: 07/08/2005 Document Revised: 09/30/2011 Document Reviewed: 09/13/2010 [facility] Patient Information 2015 [place], [place]. This information is not intended to replace advice given to you by your health care provider. Make sure you discuss any questions you have with your health care provider.

## 2015-06-28 ENCOUNTER — Other Ambulatory Visit: Payer: Self-pay | Admitting: Obstetrics & Gynecology

## 2015-10-27 DIAGNOSIS — J301 Allergic rhinitis due to pollen: Secondary | ICD-10-CM | POA: Diagnosis not present

## 2015-10-27 DIAGNOSIS — J3089 Other allergic rhinitis: Secondary | ICD-10-CM | POA: Diagnosis not present

## 2015-11-02 DIAGNOSIS — J301 Allergic rhinitis due to pollen: Secondary | ICD-10-CM | POA: Diagnosis not present

## 2015-11-02 DIAGNOSIS — J3089 Other allergic rhinitis: Secondary | ICD-10-CM | POA: Diagnosis not present

## 2015-11-10 DIAGNOSIS — J3089 Other allergic rhinitis: Secondary | ICD-10-CM | POA: Diagnosis not present

## 2015-11-10 DIAGNOSIS — J301 Allergic rhinitis due to pollen: Secondary | ICD-10-CM | POA: Diagnosis not present

## 2015-11-17 DIAGNOSIS — J3089 Other allergic rhinitis: Secondary | ICD-10-CM | POA: Diagnosis not present

## 2015-11-17 DIAGNOSIS — J301 Allergic rhinitis due to pollen: Secondary | ICD-10-CM | POA: Diagnosis not present

## 2015-11-24 DIAGNOSIS — J301 Allergic rhinitis due to pollen: Secondary | ICD-10-CM | POA: Diagnosis not present

## 2015-11-24 DIAGNOSIS — J3089 Other allergic rhinitis: Secondary | ICD-10-CM | POA: Diagnosis not present

## 2015-12-01 DIAGNOSIS — J301 Allergic rhinitis due to pollen: Secondary | ICD-10-CM | POA: Diagnosis not present

## 2015-12-01 DIAGNOSIS — J3089 Other allergic rhinitis: Secondary | ICD-10-CM | POA: Diagnosis not present

## 2015-12-07 ENCOUNTER — Encounter: Payer: Self-pay | Admitting: Obstetrics & Gynecology

## 2015-12-07 ENCOUNTER — Ambulatory Visit (INDEPENDENT_AMBULATORY_CARE_PROVIDER_SITE_OTHER): Payer: BLUE CROSS/BLUE SHIELD | Admitting: Obstetrics & Gynecology

## 2015-12-07 VITALS — BP 92/58 | HR 58 | Resp 12 | Ht 62.25 in | Wt 125.2 lb

## 2015-12-07 DIAGNOSIS — Z0000 Encounter for general adult medical examination without abnormal findings: Secondary | ICD-10-CM

## 2015-12-07 DIAGNOSIS — R5383 Other fatigue: Secondary | ICD-10-CM

## 2015-12-07 DIAGNOSIS — Z01419 Encounter for gynecological examination (general) (routine) without abnormal findings: Secondary | ICD-10-CM | POA: Diagnosis not present

## 2015-12-07 DIAGNOSIS — N9489 Other specified conditions associated with female genital organs and menstrual cycle: Secondary | ICD-10-CM | POA: Diagnosis not present

## 2015-12-07 DIAGNOSIS — N938 Other specified abnormal uterine and vaginal bleeding: Secondary | ICD-10-CM | POA: Diagnosis not present

## 2015-12-07 LAB — COMPREHENSIVE METABOLIC PANEL
ALBUMIN: 4.2 g/dL (ref 3.6–5.1)
ALK PHOS: 35 U/L (ref 33–115)
ALT: 9 U/L (ref 6–29)
AST: 13 U/L (ref 10–35)
BUN: 11 mg/dL (ref 7–25)
CALCIUM: 8.8 mg/dL (ref 8.6–10.2)
CHLORIDE: 102 mmol/L (ref 98–110)
CO2: 26 mmol/L (ref 20–31)
Creat: 0.73 mg/dL (ref 0.50–1.10)
Glucose, Bld: 83 mg/dL (ref 65–99)
POTASSIUM: 4.1 mmol/L (ref 3.5–5.3)
Sodium: 138 mmol/L (ref 135–146)
TOTAL PROTEIN: 6.7 g/dL (ref 6.1–8.1)
Total Bilirubin: 0.9 mg/dL (ref 0.2–1.2)

## 2015-12-07 LAB — CBC
HCT: 39.5 % (ref 35.0–45.0)
Hemoglobin: 13.5 g/dL (ref 11.7–15.5)
MCH: 32.6 pg (ref 27.0–33.0)
MCHC: 34.2 g/dL (ref 32.0–36.0)
MCV: 95.4 fL (ref 80.0–100.0)
MPV: 10 fL (ref 7.5–12.5)
Platelets: 292 10*3/uL (ref 140–400)
RBC: 4.14 MIL/uL (ref 3.80–5.10)
RDW: 13.3 % (ref 11.0–15.0)
WBC: 4.4 10*3/uL (ref 3.8–10.8)

## 2015-12-07 LAB — VITAMIN B12: Vitamin B-12: 360 pg/mL (ref 200–1100)

## 2015-12-07 LAB — TSH: TSH: 1.42 m[IU]/L

## 2015-12-07 LAB — FERRITIN: FERRITIN: 32 ng/mL (ref 10–232)

## 2015-12-07 NOTE — Progress Notes (Addendum)
45 y.o. [id number] DivorcedCaucasianF here for annual exam.  Doing well.  Job it really good.  Betsy Coder is 45 yo.  She is driver's ed.  She will get her license this summer.  Daughter has a job this summer.    Having more issues with spotting with IUD.  This has been going on for four months.  This happened with her last Mirena IUD and I replaced her IUD early with the prior IUD.  She is feeling much more fatigued in the evenings after work.  Has hx of attention issues but she's figured out ways to "get around this".  Wonders if this is making her have more fatigue.  Would like some blood work today, as well.  Newly SA.  He is 9 years younger than pt.  She is not sure is this relationship is going to last.    No LMP recorded. Patient is not currently having periods (Reason: IUD).          Sexually active: Yes.    The current method of family planning is IUD.    Exercising: Yes.    circuit, running, weights Smoker:  no  Health Maintenance: Pap:  09/30/2014 negative, HR HPV negative  History of abnormal Pap:  no MMG:  05/27/2012 BIRADS 2 benign  Colonoscopy:  none BMD:   >5 years TDaP:  Has allergy to component of the Tdap Screening Labs: discuss with provider, Hb today: discuss with provider, Urine today: declined   reports that she has never smoked. She has never used smokeless tobacco. She reports that she drinks alcohol. She reports that she does not use illicit drugs.  Past Medical History  Diagnosis Date  . IUD   . Headache, migraine   . PONV (postoperative nausea and vomiting)   . Asthma     EXERCISE INDUCED  . ADD (attention deficit disorder)   . Environmental allergies   . Acne     adult-on doxycycline daily  . Depression 2006    and anxiety  . Osteopenia 2006  . history of abnormal Pap smear        . Seasonal allergies   . Allergy     Past Surgical History  Procedure Laterality Date  . Nasal sinus surgery      1999  . Cesarean section  06/2000  . Breast  enhancement surgery  2002    bilateral implants  . Laparotomy  04/21/2012    Procedure: EXPLORATORY LAPAROTOMY;  Surgeon: Janie Morning, MD PHD;  Location: [facility];  Service: Gynecology;  Laterality: N/A;  . Salpingoophorectomy  04/21/2012    Procedure: SALPINGO OOPHERECTOMY;  Surgeon: Janie Morning, MD PHD;  Location: [facility];  Service: Gynecology;  Laterality: Right;  . Intrauterine device (iud) insertion      mirena  . Cosmetic surgery    . Abdominal hysterectomy      Current Outpatient Prescriptions  Medication Sig Dispense Refill  . benzonatate (TESSALON) 100 MG capsule Take 1 capsule (100 mg total) by mouth 3 (three) times daily as needed for cough. 20 capsule 0  . cefUROXime (CEFTIN) 250 MG tablet Take 300 mg by mouth 2 (two) times daily with a meal.    . fexofenadine-pseudoephedrine (ALLEGRA-D 24) 180-240 MG per 24 hr tablet Take 1 tablet by mouth daily. 1/2 tablet daily    . levonorgestrel (MIRENA) 20 MCG/24HR IUD 1 each by Intrauterine route once.    . Multiple Vitamin (MULTIVITAMIN) tablet Take 1 tablet by mouth daily. Ab cuts sunflower oil,  fish oil, flax seed oi.    . Multiple Vitamins-Minerals (MULTIVITAMIN PO) Take by mouth. Focus factor vitamin daily    . Nutritional Supplements (JUICE PLUS FIBRE PO) Take by mouth.    . Olopatadine HCl (PATANASE NA) Place 1 spray into the nose daily.    . predniSONE (STERAPRED UNI-PAK 21 TAB) 10 MG (21) TBPK tablet Take 10 mg by mouth daily.    . rizatriptan (MAXALT) 10 MG tablet Take 1 tablet (10 mg total) by mouth as needed for migraine. May repeat in 2 hours if needed 10 tablet 1  . [DISCONTINUED] phentermine 15 MG capsule Take 1 capsule (15 mg total) by mouth every morning. 30 capsule 1   No current facility-administered medications for this visit.    Family History  Problem Relation Age of Onset  . Cancer Father     prostate, bone  . Hypertension Maternal Grandmother   . Hypertension Paternal Grandfather   . Heart attack Paternal  Grandfather   . Hypertension Paternal Grandmother   . Hyperlipidemia Sister     ROS:  Pertinent items are noted in HPI.  Otherwise, a comprehensive ROS was negative.  Exam:   General appearance: alert, cooperative and appears stated age Head: Normocephalic, without obvious abnormality, atraumatic Neck: no adenopathy, supple, symmetrical, trachea midline and thyroid normal to inspection and palpation Lungs: clear to auscultation bilaterally Breasts: normal appearance, no masses or tenderness Heart: regular rate and rhythm Abdomen: soft, non-tender; bowel sounds normal; no masses,  no organomegaly Extremities: extremities normal, atraumatic, no cyanosis or edema Skin: Skin color, texture, turgor normal. No rashes or lesions Lymph nodes: Cervical, supraclavicular, and axillary nodes normal. No abnormal inguinal nodes palpated Neurologic: Grossly normal   Pelvic: External genitalia:  no lesions              Urethra:  normal appearing urethra with no masses, tenderness or lesions              Bartholins and Skenes: normal                 Vagina: normal appearing vagina with normal color and discharge, no lesions              Cervix: no lesions              Pap taken: No. Bimanual Exam:  Uterus:  normal size, contour, position, consistency, mobility, non-tender              Adnexa: no mass, fullness, tenderness               Rectovaginal: Confirms               Anus:  normal sphincter tone, no lesions  Chaperone was present for exam.  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  Spotting with current IUD. Attention issues Fatigue  P: Mammogram due. Pt aware and will schedule. pap smear with neg HR HPV 3/16.  No Pap obtained today CBC, CMP, TSH FSH Ferritin and B12 D/W pt possible referral to [facility].  She will consider this. return annually or prn

## 2015-12-07 NOTE — Addendum Note (Signed)
Addended by: [name] on: 12/07/2015 10:47 AM   Modules accepted: Miquel Dunn

## 2015-12-07 NOTE — Patient Instructions (Addendum)
[facility] [place]  [facility], [phone]

## 2015-12-08 LAB — FOLLICLE STIMULATING HORMONE: FSH: 23.5 m[IU]/mL

## 2015-12-11 ENCOUNTER — Telehealth: Payer: Self-pay

## 2015-12-11 ENCOUNTER — Other Ambulatory Visit: Payer: Self-pay | Admitting: Obstetrics & Gynecology

## 2015-12-11 NOTE — Telephone Encounter (Signed)
Attempted to reach patient at number provided [phone]. Phone rang with no answer then there was a busy tone.

## 2015-12-11 NOTE — Telephone Encounter (Signed)
-----   Message from [name], MD sent at 12/08/2015  7:17 AM EDT ----- Please let pt know that her [place] is elevated but not in full menopausal range yet so if she wants to proceed with IUD placement, she can.  Susan West did benefits yesterday and there is full coverage for her IUD.  May want to let Susan West know you are going to call her.)  Everything else--CBC, CMP, TSH, B12, Ferritin were normal.  I did these for screening and due to fatigue.  She and I discussed her being seen at the [facility] as I think some attention issues are playing a part in her fatigue as she has to really focus a lot at work.  She was not sure that she wanted to do this.  If she changes her mind, she can let me know.  Thanks.

## 2015-12-12 ENCOUNTER — Encounter: Payer: Self-pay | Admitting: Obstetrics & Gynecology

## 2015-12-12 ENCOUNTER — Ambulatory Visit (INDEPENDENT_AMBULATORY_CARE_PROVIDER_SITE_OTHER): Payer: BLUE CROSS/BLUE SHIELD | Admitting: Obstetrics & Gynecology

## 2015-12-12 VITALS — BP 98/60 | HR 72 | Resp 16 | Ht 62.75 in | Wt 125.0 lb

## 2015-12-12 DIAGNOSIS — N898 Other specified noninflammatory disorders of vagina: Secondary | ICD-10-CM | POA: Diagnosis not present

## 2015-12-12 MED ORDER — FLUCONAZOLE 150 MG PO TABS: ORAL_TABLET | ORAL | Status: DC

## 2015-12-12 MED ORDER — METRONIDAZOLE 0.75 % VA GEL: 1.0000 | Freq: Every day | VAGINAL | Status: DC

## 2015-12-12 MED ORDER — TRIAMCINOLONE ACETONIDE 0.5 % EX OINT: 1.0000 "application " | TOPICAL_OINTMENT | Freq: Four times a day (QID) | CUTANEOUS | Status: DC

## 2015-12-12 NOTE — Telephone Encounter (Addendum)
Spoke with patient. Patient states that she has recently become sexually active. Over the weekend she began to experience vaginal burning and soreness. "I am not sure if it is BV or yeast." Reports she used 1 applicator full of Metrogel on 5/21. Yesterday 5/23 she used Monistat 1 day. Now feels like her vagina is "on fire. It is miserable." Advised she will need to be seen in the office for further evaluation. She is agreeable. Appointment scheduled for today 12/12/2015 at 10:15 am with Dr.Miller. She is agreeable to date and time. While patient is on the line I advised of her results as seen below from [place]. She verbalizes understanding. Would like to check with her work and return call to schedule IUD removal and reinsertion. Patient does not desire to be seen with [facility] at this time. She will return call if she changes her mind.  Routing to provider for final review. Patient agreeable to disposition. Will close encounter.

## 2015-12-12 NOTE — Progress Notes (Signed)
GYNECOLOGY  VISIT   HPI: 45 y.o. [id number] Divorced Caucasian female here for complaint of vaginal/vulvar discharge that started on "Sunday.  She was having a little bit of irritation.  She used an OTC yeast cream and she had a little bit of metrogel that she used as well.  She feels she has significant discharge yesterday.  She is very itchy and uncomfortable.  She states she's feeling swollen as well.    No urinary urgency or hematuria.  No fevers.  No back pain.  GYNECOLOGIC HISTORY: No LMP recorded. Patient is not currently having periods (Reason: IUD).  There are no active problems to display for this patient.   Past Medical History  Diagnosis Date  . IUD   . Headache, migraine   . PONV (postoperative nausea and vomiting)   . Asthma     EXERCISE INDUCED  . ADD (attention deficit disorder)   . Environmental allergies   . Acne     adult-on doxycycline daily  . Depression 2006    and anxiety  . Osteopenia 2006  . history of abnormal Pap smear        . Seasonal allergies   . Allergy     Past Surgical History  Procedure Laterality Date  . Nasal sinus surgery      19" 99  . Cesarean section  06/2000  . Breast enhancement surgery  2002    bilateral implants  . Laparotomy  04/21/2012    Procedure: EXPLORATORY LAPAROTOMY;  Surgeon: Janie Morning, MD PHD;  Location: [facility];  Service: Gynecology;  Laterality: N/A;  . Salpingoophorectomy  04/21/2012    Procedure: SALPINGO OOPHERECTOMY;  Surgeon: Janie Morning, MD PHD;  Location: [facility];  Service: Gynecology;  Laterality: Right;  . Intrauterine device (iud) insertion      mirena  . Cosmetic surgery    . Abdominal hysterectomy      MEDS:  Reviewed in EPIC and UTD  ALLERGIES: Penicillins; Azithromycin; Claritin; Tdap; and Ultracet  Family History  Problem Relation Age of Onset  . Cancer Father     prostate, bone  . Hypertension Maternal Grandmother   . Hypertension Paternal Grandfather   . Heart attack Paternal  Grandfather   . Hypertension Paternal Grandmother   . Hyperlipidemia Sister     SH:  Single, non-smoker  Review of Systems  All other systems reviewed and are negative.   PHYSICAL EXAMINATION:    BP 98/60 mmHg  Pulse 72  Resp 16  Ht 5' 2.75" (1.594 m)  Wt 125 lb (56.7 kg)  BMI 22.32 kg/m2    General appearance: alert, cooperative and appears stated age Abdomen: soft, non-tender; bowel sounds normal; no masses,  no organomegaly  Pelvic: External genitalia:  no lesions              Urethra:  normal appearing urethra with no masses, tenderness or lesions              Bartholins and Skenes: normal                 Vagina: erythematous vagina with thick whitish discharge and evidence of vaginal cream present              Cervix: no lesions and and IUD string noted              Bimanual Exam:  Uterus:  normal size, contour, position, consistency, mobility, non-tender              Adnexa:  no mass, fullness, tenderness              Anus:  normal sphincter tone, no lesions  Chaperone was present for exam.  Assessment: Vaginal discharge and irritation Topical metrogel made symptoms better but now worse after OTC one day vaginal yeast treatment--possible chemical irritation  Plan: Diflucan 150mg  po x 1, repeat 72 hours.  #2/RFs given Metrogel 0.75% ointment qhs x 5 days Affirm pending Topical triamcinolone ointment 1% externally up to QID

## 2015-12-13 ENCOUNTER — Telehealth: Payer: Self-pay

## 2015-12-13 LAB — WET PREP BY MOLECULAR PROBE
CANDIDA SPECIES: POSITIVE — AB
GARDNERELLA VAGINALIS: POSITIVE — AB
TRICHOMONAS VAG: NEGATIVE

## 2015-12-13 NOTE — Telephone Encounter (Signed)
Patient returned call and she is given message regarding results from Dr. Sabra Heck.  Patient states she noticed some increase in thin grey/green discharge last night and some intermittent LLQ cramps. No fevers or change in bowel or bladder.  Advised discharge and cramps can occur with bacterial vaginosis that she has been diagnosed with. Patient asks if she can work out while under treatment, advised she can work out to her comfort, ensure to change out of wet/warm clothes and allow skin to dry after workout.  Patient reminded not use any alcohol or alcohol containing products while using Metrogel as can cause nausea, abdominal pain, cramping.   Patient is advised to call back if symptoms worsen or persist. She is agreeable.  Routing to provider for final review. Patient agreeable to disposition. Will close encounter.

## 2015-12-13 NOTE — Telephone Encounter (Signed)
Left message to call Kathrene Sinopoli at [phone]. 

## 2015-12-13 NOTE — Telephone Encounter (Signed)
Patient returning call.

## 2015-12-13 NOTE — Telephone Encounter (Signed)
-----   Message from [name], MD sent at 12/13/2015  8:26 AM EDT ----- Please let pt know that she has both BV and yeast.  I gave her prescriptions for metrogel and diflucan yesterday.  She needs to finish both prescriptions.  Thanks.

## 2015-12-15 DIAGNOSIS — J3089 Other allergic rhinitis: Secondary | ICD-10-CM | POA: Diagnosis not present

## 2015-12-15 DIAGNOSIS — J3081 Allergic rhinitis due to animal (cat) (dog) hair and dander: Secondary | ICD-10-CM | POA: Diagnosis not present

## 2015-12-15 DIAGNOSIS — J301 Allergic rhinitis due to pollen: Secondary | ICD-10-CM | POA: Diagnosis not present

## 2015-12-19 ENCOUNTER — Telehealth: Payer: Self-pay | Admitting: Obstetrics & Gynecology

## 2015-12-19 NOTE — Telephone Encounter (Signed)
Left message to call Kodey Xue at [phone]. 

## 2015-12-19 NOTE — Telephone Encounter (Signed)
Left detailed message at number provided [phone], okay per ROI. Advised of message as seen below from [place]. Advised rx for Diflucan which [facility] [place] [facility] remaining. She will need to contact the pharmacy to have prescription refilled. May keep her appointment as scheduled for IUD removal and reinsertion on 12/21/2015. Advised to return call with any further questions.  Routing to provider for final review. Patient agreeable to disposition. Will close encounter.

## 2015-12-19 NOTE — Telephone Encounter (Signed)
Spoke with patient. Patient was seen on 12/12/2015 with Dr.Miller and treated for BV and yeast. Completed her 5 days of Metrogel on 12/16/2015. Took her second dose of Diflucan on 12/15/2015. Reports she is still experiencing vaginal irritation and thick which discharge. "It has gotten better, but it not gone." Patient is concerned as she is scheduled for IUD removal and reinsertion on 12/21/2015. Asking if she may take another dose of Diflucan at this time and if she needs to move her IUD appointment. Advised I will speak with Dr.Miller and return call with further recommendations. She is agreeable.

## 2015-12-19 NOTE — Telephone Encounter (Signed)
Yes.  Ok to treat with Diflucan 150mg  po 1, repeat 72 hours, again.  Ok to call in for pt.  Thanks.

## 2015-12-19 NOTE — Telephone Encounter (Signed)
Patient called and said, "I have an appointment later this week to change my IUD but my bacterial for yeast infection symptoms have not gone away yet. I don't know if there is something else I should try or if I should just get my refill on fluconazole. I'd like a call back before lunch, if possible, so I can get my refill then, if needed."

## 2015-12-21 ENCOUNTER — Ambulatory Visit (INDEPENDENT_AMBULATORY_CARE_PROVIDER_SITE_OTHER): Payer: BLUE CROSS/BLUE SHIELD | Admitting: Obstetrics & Gynecology

## 2015-12-21 ENCOUNTER — Encounter: Payer: Self-pay | Admitting: Obstetrics & Gynecology

## 2015-12-21 VITALS — BP 102/60 | HR 70 | Resp 16 | Wt 126.0 lb

## 2015-12-21 DIAGNOSIS — N898 Other specified noninflammatory disorders of vagina: Secondary | ICD-10-CM

## 2015-12-21 DIAGNOSIS — Z30433 Encounter for removal and reinsertion of intrauterine contraceptive device: Secondary | ICD-10-CM

## 2015-12-21 DIAGNOSIS — N899 Noninflammatory disorder of vagina, unspecified: Secondary | ICD-10-CM | POA: Diagnosis not present

## 2015-12-21 NOTE — Progress Notes (Signed)
45 y.o. G10P0012 Divorced Caucasian female presents for removal of Mirena IUD and re-insertion of IUD.  She is planning on using another Mirena IUD.  Pt has been counseled about alternative forms of contraception including OCPs, progesterone options, sterilization procedures, condoms, and natural family planning.  She feels IUD is the better option for her.  Pt has also been counseled about risks and benefits as well as complications.  Consent is obtained today.  All questions answered prior to start of procedure.   She is feeling better from her yeast vaginitis and BV diagnosis a little over a week ago.  She is still having some discharge.  The irritation and swelling are much improved.    LMP:  No LMP recorded. Patient is not currently having periods (Reason: IUD).  There are no active problems to display for this patient.  Past Medical History  Diagnosis Date  . IUD   . Headache, migraine   . PONV (postoperative nausea and vomiting)   . Asthma     EXERCISE INDUCED  . ADD (attention deficit disorder)   . Environmental allergies   . Acne     adult-on doxycycline daily  . Depression 2006    and anxiety  . Osteopenia 2006  . history of abnormal Pap smear        . Seasonal allergies   . Allergy    Current Outpatient Prescriptions on File Prior to Visit  Medication Sig Dispense Refill  . cetirizine (ZYRTEC) 10 MG tablet Take 10 mg by mouth daily.    [name] levonorgestrel (MIRENA) 20 MCG/24HR IUD 1 each by Intrauterine route once.    . Multiple Vitamin (MULTIVITAMIN) tablet Take 1 tablet by mouth daily. Ab cuts sunflower oil, fish oil, flax seed oi.    . Multiple Vitamins-Minerals (MULTIVITAMIN PO) Take by mouth. Focus factor vitamin daily    . Nutritional Supplements (JUICE PLUS FIBRE PO) Take by mouth.    . rizatriptan (MAXALT) 10 MG tablet Take 1 tablet (10 mg total) by mouth as needed for migraine. May repeat in 2 hours if needed 10 tablet 1  . Triamcinolone Acetonide (NASACORT AQ NA)  Place into the nose daily.    [name] triamcinolone ointment (KENALOG) 0.5 % Apply 1 application topically 4 (four) times daily. 15 g 1  . fluconazole (DIFLUCAN) 150 MG tablet Take one tab po x 1, repeat in 72 hours (Patient not taking: Reported on 12/21/2015) 2 tablet 3  . metroNIDAZOLE (METROGEL) 0.75 % vaginal gel Place 1 Applicatorful vaginally at bedtime. (Patient not taking: Reported on 12/21/2015) 70 g 0  . [DISCONTINUED] phentermine 15 MG capsule Take 1 capsule (15 mg total) by mouth every morning. 30 capsule 1   No current facility-administered medications on file prior to visit.   Penicillins; Azithromycin; Claritin; Tdap; and Ultracet  ROS Filed Vitals:   12/21/15 1002  BP: 102/60  Pulse: 70  Resp: 16  Weight: 126 lb (57.153 kg)    Gen:  WNWF healthy female NAD Abdomen: soft, non-tender Groin:  no inguinal nodes palpated  Pelvic exam: Vulva:  normal female genitalia Vagina:  normal except for some whitish discharge, significantly decreased from prior exam Cervix:  Non-tender, Negative CMT, no lesions or redness. Uterus:  normal shape, position and consistency   Procedure:  Speculum reinserted.  Cervix visualized.  Affirm obtained due to discharge.  Cervix then cleansed with Betadine x 3.  Paracervical block was not placed.  Single toothed tenaculum applied to anterior lip of cervix without difficulty.  IUD string noted and grasped with ringed forcep.  With one pull, IUD removed easily.  Pt has some mild cramping but tolerated this well.  Then uterus sounded to 9cm.  Lot number: [id number].  Expiration:  1/20.  IUD package was opened.  IUD and introducer passed to fundus and then withdrawn slightly before IUD was passed into endometrial cavity.  Introducer removed.  Strings cut to 2cm.  Tenaculum removed from cervix.  Minimal bleeding noted.  Pt tolerated the procedure well.  All instruments removed from vagina.  A: Removal of Mirena IUD and reinsertion of Mirena IUD Contraception  desires Vaginal discharge with recent yeast diagnosis and recent BV  P:  As pt is very familiar with IUD placement so will call with any concerns for follow up Pt aware removal due no later than 12/20/2020.  IUD card given to pt. Repeat affirm pending.

## 2015-12-22 LAB — WET PREP BY MOLECULAR PROBE
CANDIDA SPECIES: NEGATIVE
Gardnerella vaginalis: POSITIVE — AB
Trichomonas vaginosis: NEGATIVE

## 2015-12-22 MED ORDER — METRONIDAZOLE 500 MG PO TABS: 500.0000 mg | ORAL_TABLET | Freq: Two times a day (BID) | ORAL | Status: DC

## 2015-12-22 NOTE — Addendum Note (Signed)
Addended by: [name] on: 12/22/2015 04:14 PM   Modules accepted: Orders

## 2015-12-28 DIAGNOSIS — J3089 Other allergic rhinitis: Secondary | ICD-10-CM | POA: Diagnosis not present

## 2015-12-28 DIAGNOSIS — J301 Allergic rhinitis due to pollen: Secondary | ICD-10-CM | POA: Diagnosis not present

## 2016-01-05 DIAGNOSIS — J3089 Other allergic rhinitis: Secondary | ICD-10-CM | POA: Diagnosis not present

## 2016-01-05 DIAGNOSIS — J301 Allergic rhinitis due to pollen: Secondary | ICD-10-CM | POA: Diagnosis not present

## 2016-01-12 ENCOUNTER — Telehealth: Payer: Self-pay | Admitting: Obstetrics & Gynecology

## 2016-01-12 DIAGNOSIS — J301 Allergic rhinitis due to pollen: Secondary | ICD-10-CM | POA: Diagnosis not present

## 2016-01-12 DIAGNOSIS — J3089 Other allergic rhinitis: Secondary | ICD-10-CM | POA: Diagnosis not present

## 2016-01-12 MED ORDER — METRONIDAZOLE 500 MG PO TABS: 500.0000 mg | ORAL_TABLET | Freq: Two times a day (BID) | ORAL | Status: DC

## 2016-01-12 NOTE — Telephone Encounter (Signed)
Patient calling, she completed Flagyl 500 mg po for 7 days approximately 12/31/15. She was travelling and feels she may have missed doses or did not complete course of medications correctly.  On Tuesday, she developed increased thin clear vaginal discharge and feelings of burning of skin. Feels she has bacterial vaginosis again and worried it is because she did not take Flagyl as directed.  She has metrogel at home, but did not want to use it as to help with not causing a yeast infection. She denies pelvic pain or STD concerns.   Patient asking to advise Dr. Sabra Heck of her concerns and if she thinks she needs treatment again with oral flagyl or can use her Metrogel that she has at home.  Advised will send message to Dr. Sabra Heck and return call with response. Patient agreeable.

## 2016-01-12 NOTE — Telephone Encounter (Signed)
Ok to retreat with flagyl 500mg  bid x 7 days.  #14/0RF.  If happens again, she needs OV.

## 2016-01-12 NOTE — Telephone Encounter (Signed)
Call to patient. Detailed message left okay per designated party release form.  Advised of instructions and message from Dr. Sabra Heck.  Advised will need office visit if continued symptoms.  Advised to call back with any questions.  [facility], patient has been given alcohol precautions previously by Dr. Sabra Heck.

## 2016-01-12 NOTE — Telephone Encounter (Signed)
Patient is calling to talk with Dr.Miller's nurse about an ongoing problem.

## 2016-01-15 DIAGNOSIS — S134XXA Sprain of ligaments of cervical spine, initial encounter: Secondary | ICD-10-CM | POA: Diagnosis not present

## 2016-01-15 DIAGNOSIS — S233XXA Sprain of ligaments of thoracic spine, initial encounter: Secondary | ICD-10-CM | POA: Diagnosis not present

## 2016-01-15 DIAGNOSIS — S335XXA Sprain of ligaments of lumbar spine, initial encounter: Secondary | ICD-10-CM | POA: Diagnosis not present

## 2016-01-15 NOTE — Telephone Encounter (Signed)
Return call to patient and detailed message left, okay per designated party release form.  Advised to call [place] center at [facility] at [phone] Advised to please call back with any questions.   Routing to Dr. Sabra Heck and closing telephone encounter.

## 2016-01-15 NOTE — Telephone Encounter (Signed)
Patient wants to know the name and phone number of the mammogram place in [place] that was recommended to her.

## 2016-01-16 ENCOUNTER — Other Ambulatory Visit: Payer: Self-pay | Admitting: Obstetrics & Gynecology

## 2016-01-16 DIAGNOSIS — J301 Allergic rhinitis due to pollen: Secondary | ICD-10-CM | POA: Diagnosis not present

## 2016-01-16 DIAGNOSIS — J3089 Other allergic rhinitis: Secondary | ICD-10-CM | POA: Diagnosis not present

## 2016-01-16 DIAGNOSIS — Z1231 Encounter for screening mammogram for malignant neoplasm of breast: Secondary | ICD-10-CM

## 2016-01-18 ENCOUNTER — Encounter: Payer: Self-pay | Admitting: Obstetrics & Gynecology

## 2016-01-18 ENCOUNTER — Ambulatory Visit (INDEPENDENT_AMBULATORY_CARE_PROVIDER_SITE_OTHER): Payer: BLUE CROSS/BLUE SHIELD | Admitting: Obstetrics & Gynecology

## 2016-01-18 ENCOUNTER — Telehealth: Payer: Self-pay | Admitting: Obstetrics & Gynecology

## 2016-01-18 VITALS — BP 94/54 | HR 82 | Resp 14 | Wt 125.6 lb

## 2016-01-18 DIAGNOSIS — N898 Other specified noninflammatory disorders of vagina: Secondary | ICD-10-CM

## 2016-01-18 NOTE — Progress Notes (Signed)
GYNECOLOGY  VISIT   HPI: 45 y.o. G27P0012 Divorced Caucasian female with complaint of vaginal discharge, again.  Pt seen 12/12/15 with significant vaginal discharge.  BV and yeast were both noted and treated.  Pt returned on 12/21/15 for IUD removal and replacement.  Discharge was much improved but not gone.  Repeat Affirm testing was done and showed BV.  She has done well with the IUD removal and replacement and has no bleeding or pain.  Also, denies pain with intercourse.  She felt the discharge completely resolved but has restarted.  It is "nothing like it was".  Partner is without complaints, although states he can feel her string.  She does not want the string cut shorter.  Pt denies itching or urinary symptoms.  She is having some lower back pain.  She was also having some side effects that she thinks were related to the flagyl but these have resolved since she stopped it.   GYNECOLOGIC HISTORY: No LMP recorded. Patient is not currently having periods (Reason: IUD). Contraception: Mirena IUD   Past Medical History  Diagnosis Date  . IUD   . Headache, migraine   . PONV (postoperative nausea and vomiting)   . Asthma     EXERCISE INDUCED  . ADD (attention deficit disorder)   . Environmental allergies   . Acne     adult-on doxycycline daily  . Depression 2006    and anxiety  . Osteopenia 2006  . history of abnormal Pap smear        . Seasonal allergies   . Allergy     Past Surgical History  Procedure Laterality Date  . Nasal sinus surgery      1999  . Cesarean section  06/2000  . Breast enhancement surgery  2002    bilateral implants  . Laparotomy  04/21/2012    Procedure: EXPLORATORY LAPAROTOMY;  Surgeon: Janie Morning, MD PHD;  Location: [facility];  Service: Gynecology;  Laterality: N/A;  . Salpingoophorectomy  04/21/2012    Procedure: SALPINGO OOPHERECTOMY;  Surgeon: Janie Morning, MD PHD;  Location: [facility];  Service: Gynecology;  Laterality: Right;  . Intrauterine device  (iud) insertion      mirena  . Cosmetic surgery    . Abdominal hysterectomy      MEDS:  Reviewed in EPIC and UTD  ALLERGIES: Penicillins; Azithromycin; Claritin; Tdap; and Ultracet  Family History  Problem Relation Age of Onset  . Cancer Father     prostate, bone  . Hypertension Maternal Grandmother   . Hypertension Paternal Grandfather   . Heart attack Paternal Grandfather   . Hypertension Paternal Grandmother   . Hyperlipidemia Sister     SH:  Divorced, non smoker  Review of Systems  All other systems reviewed and are negative.   PHYSICAL EXAMINATION:    BP 94/54 mmHg  Pulse 82  Resp 14  Wt 125 lb 9.6 oz (56.972 kg)    General appearance: alert, cooperative and appears stated age Abdomen: soft, non-tender; bowel sounds normal; no masses,  no organomegaly  Pelvic: External genitalia:  no lesions              Urethra:  normal appearing urethra with no masses, tenderness or lesions              Bartholins and Skenes: normal                 Vagina: normal appearing vagina with normal color with small amount of white vaginal discharge,  no lesions, affirm obtained              Cervix: no lesions and and 2cm IUD string noted              Bimanual Exam:  Uterus:  normal size, contour, position, consistency, mobility, non-tender              Adnexa: no mass, fullness, tenderness  Chaperone was present for exam.  Assessment: Recurrent vaginal discharge with h/o yeast and BV in the past 6 weeks. New sexual partner   Plan: Affirm testing pending.  Will not repeat any medications until this is back GC/Chl testing pending, just to be sure.  Pt is not worried about this being positive but is ok with testing today.

## 2016-01-18 NOTE — Telephone Encounter (Signed)
Spoke with patient. Advised of message as seen below from [place]. She is agreeable. Appointment scheduled for today at 3:30 pm with Dr.Miller. She is agreeable to date and time.  Routing to provider for final review. Patient agreeable to disposition. Will close encounter.

## 2016-01-18 NOTE — Telephone Encounter (Signed)
Spoke with patient. Patient states she started a second round of Flagyl for BV because she went on vacation and missed multiple doses. States she has 3 tablets remaining in her course of treatment. Over the last few days she has been experiencing intermittent dizziness, lower back pain, and thick white discharge. Denies any nausea or vomiting with dizziness. Denies any urinary symptoms, fever, or chills. Patient took 1 Diflucan yesterday as she felt this may be related to yeast. Reports no relief today. "I am not sure if it is from the Flagyl or not." Advised I will speak with Dr.Miller regarding further recommendations and return call. She is agreeable.

## 2016-01-18 NOTE — Telephone Encounter (Signed)
Patient is asking to talk with a nurse about some side effects from metroNIDAZOLE (FLAGYL) 500 MG tablet.

## 2016-01-18 NOTE — Telephone Encounter (Signed)
Left message to call Kaitlyn at [phone]. 

## 2016-01-18 NOTE — Telephone Encounter (Signed)
Needs OV.  

## 2016-01-19 LAB — WET PREP BY MOLECULAR PROBE
Candida species: NEGATIVE
GARDNERELLA VAGINALIS: NEGATIVE
TRICHOMONAS VAG: NEGATIVE

## 2016-01-22 LAB — IPS N GONORRHOEA AND CHLAMYDIA BY PCR

## 2016-01-25 ENCOUNTER — Telehealth: Payer: Self-pay | Admitting: *Deleted

## 2016-01-25 NOTE — Telephone Encounter (Signed)
-----   Message from Nunzio Cobbs, MD sent at 01/22/2016  4:46 PM EDT ----- Please inform patient that her testing for gonorrhea and chlamydia are both negative for infection.   [place]

## 2016-01-25 NOTE — Telephone Encounter (Signed)
Patient returned call and notified of GC/Chla results. Patient verbalized understanding and states she has been using topical cream that has helped with symptom relief. Will close encounter.

## 2016-01-25 NOTE — Telephone Encounter (Signed)
Left message to call back to review results.

## 2016-01-26 DIAGNOSIS — J3089 Other allergic rhinitis: Secondary | ICD-10-CM | POA: Diagnosis not present

## 2016-01-26 DIAGNOSIS — J301 Allergic rhinitis due to pollen: Secondary | ICD-10-CM | POA: Diagnosis not present

## 2016-01-30 ENCOUNTER — Ambulatory Visit
Admission: RE | Admit: 2016-01-30 | Discharge: 2016-01-30 | Disposition: A | Discharge status: Home / Self Care | Payer: BLUE CROSS/BLUE SHIELD | Source: Ambulatory Visit | Attending: Obstetrics & Gynecology | Admitting: Obstetrics & Gynecology

## 2016-01-30 DIAGNOSIS — Z1231 Encounter for screening mammogram for malignant neoplasm of breast: Secondary | ICD-10-CM | POA: Insufficient documentation

## 2016-02-02 DIAGNOSIS — J3089 Other allergic rhinitis: Secondary | ICD-10-CM | POA: Diagnosis not present

## 2016-02-02 DIAGNOSIS — J301 Allergic rhinitis due to pollen: Secondary | ICD-10-CM | POA: Diagnosis not present

## 2016-02-09 DIAGNOSIS — J3089 Other allergic rhinitis: Secondary | ICD-10-CM | POA: Diagnosis not present

## 2016-02-09 DIAGNOSIS — J301 Allergic rhinitis due to pollen: Secondary | ICD-10-CM | POA: Diagnosis not present

## 2016-02-15 DIAGNOSIS — J3089 Other allergic rhinitis: Secondary | ICD-10-CM | POA: Diagnosis not present

## 2016-02-15 DIAGNOSIS — J301 Allergic rhinitis due to pollen: Secondary | ICD-10-CM | POA: Diagnosis not present

## 2016-02-22 DIAGNOSIS — J3089 Other allergic rhinitis: Secondary | ICD-10-CM | POA: Diagnosis not present

## 2016-02-22 DIAGNOSIS — J301 Allergic rhinitis due to pollen: Secondary | ICD-10-CM | POA: Diagnosis not present

## 2016-02-28 ENCOUNTER — Other Ambulatory Visit: Payer: Self-pay | Admitting: Obstetrics & Gynecology

## 2016-02-29 DIAGNOSIS — J3089 Other allergic rhinitis: Secondary | ICD-10-CM | POA: Diagnosis not present

## 2016-02-29 DIAGNOSIS — J301 Allergic rhinitis due to pollen: Secondary | ICD-10-CM | POA: Diagnosis not present

## 2016-02-29 NOTE — Telephone Encounter (Signed)
Medication refill request: Doxycycline 100MG  Tablets Last AEX:  12/21/15 SM Next AEX: 03/14/17 SM Last MMG (if hormonal medication request): 01/30/16 [id number] Refill authorized: Historical entry

## 2016-03-15 DIAGNOSIS — J301 Allergic rhinitis due to pollen: Secondary | ICD-10-CM | POA: Diagnosis not present

## 2016-03-15 DIAGNOSIS — Z91018 Allergy to other foods: Secondary | ICD-10-CM | POA: Diagnosis not present

## 2016-03-15 DIAGNOSIS — J3089 Other allergic rhinitis: Secondary | ICD-10-CM | POA: Diagnosis not present

## 2016-03-15 DIAGNOSIS — J4520 Mild intermittent asthma, uncomplicated: Secondary | ICD-10-CM | POA: Diagnosis not present

## 2016-03-18 DIAGNOSIS — J301 Allergic rhinitis due to pollen: Secondary | ICD-10-CM | POA: Diagnosis not present

## 2016-03-19 ENCOUNTER — Other Ambulatory Visit: Payer: Self-pay | Admitting: Certified Nurse Midwife

## 2016-03-19 DIAGNOSIS — G43709 Chronic migraine without aura, not intractable, without status migrainosus: Secondary | ICD-10-CM

## 2016-03-19 DIAGNOSIS — J3089 Other allergic rhinitis: Secondary | ICD-10-CM | POA: Diagnosis not present

## 2016-03-19 NOTE — Telephone Encounter (Signed)
Medication refill request: Rizatriptan Last AEX:  12/21/15 SM Next AEX: 03/14/17 SM Last MMG (if hormonal medication request): 01/30/16 [id number]  Refill authorized: 10/03/14 #10 1R. Please advise Thank you.

## 2016-03-28 DIAGNOSIS — J3089 Other allergic rhinitis: Secondary | ICD-10-CM | POA: Diagnosis not present

## 2016-03-28 DIAGNOSIS — J301 Allergic rhinitis due to pollen: Secondary | ICD-10-CM | POA: Diagnosis not present

## 2016-04-12 DIAGNOSIS — J3089 Other allergic rhinitis: Secondary | ICD-10-CM | POA: Diagnosis not present

## 2016-04-12 DIAGNOSIS — J301 Allergic rhinitis due to pollen: Secondary | ICD-10-CM | POA: Diagnosis not present

## 2016-04-18 DIAGNOSIS — J301 Allergic rhinitis due to pollen: Secondary | ICD-10-CM | POA: Diagnosis not present

## 2016-04-18 DIAGNOSIS — J3089 Other allergic rhinitis: Secondary | ICD-10-CM | POA: Diagnosis not present

## 2016-04-26 DIAGNOSIS — J301 Allergic rhinitis due to pollen: Secondary | ICD-10-CM | POA: Diagnosis not present

## 2016-04-26 DIAGNOSIS — J3089 Other allergic rhinitis: Secondary | ICD-10-CM | POA: Diagnosis not present

## 2016-05-03 DIAGNOSIS — J3089 Other allergic rhinitis: Secondary | ICD-10-CM | POA: Diagnosis not present

## 2016-05-03 DIAGNOSIS — J301 Allergic rhinitis due to pollen: Secondary | ICD-10-CM | POA: Diagnosis not present

## 2016-05-20 DIAGNOSIS — T1512XA Foreign body in conjunctival sac, left eye, initial encounter: Secondary | ICD-10-CM | POA: Diagnosis not present

## 2016-05-24 DIAGNOSIS — J3089 Other allergic rhinitis: Secondary | ICD-10-CM | POA: Diagnosis not present

## 2016-05-24 DIAGNOSIS — J301 Allergic rhinitis due to pollen: Secondary | ICD-10-CM | POA: Diagnosis not present

## 2016-05-31 DIAGNOSIS — J3089 Other allergic rhinitis: Secondary | ICD-10-CM | POA: Diagnosis not present

## 2016-05-31 DIAGNOSIS — J301 Allergic rhinitis due to pollen: Secondary | ICD-10-CM | POA: Diagnosis not present

## 2016-06-04 DIAGNOSIS — J301 Allergic rhinitis due to pollen: Secondary | ICD-10-CM | POA: Diagnosis not present

## 2016-06-04 DIAGNOSIS — J3089 Other allergic rhinitis: Secondary | ICD-10-CM | POA: Diagnosis not present

## 2016-06-06 DIAGNOSIS — J3089 Other allergic rhinitis: Secondary | ICD-10-CM | POA: Diagnosis not present

## 2016-06-06 DIAGNOSIS — J301 Allergic rhinitis due to pollen: Secondary | ICD-10-CM | POA: Diagnosis not present

## 2016-06-12 ENCOUNTER — Telehealth: Payer: Self-pay | Admitting: Obstetrics & Gynecology

## 2016-06-12 MED ORDER — FLUCONAZOLE 150 MG PO TABS
150.0000 mg | ORAL_TABLET | Freq: Once | ORAL | 0 refills | Status: AC
Start: 2016-06-12 — End: 2016-06-12

## 2016-06-12 NOTE — Telephone Encounter (Signed)
Spoke with patient. Patient states that she has been taking Doxycycline as needed for acne. "I only take it when my face gets bad due to the cost." Rx last written by Dr.Miller on 03/01/2016. Reports she had a yeast infection a few months ago with taking Doxycycline and was given Diflucan by her allergist as she had an appointment for separate evaluation when symptoms started. States she started developing vaginal itching two days ago. Denies irritation or discharge. Requesting rx for Diflucan be sent to pharmacy on file. "I have had chronic yeast infections so I don't want these symptoms to get worse." Advised I will speak with Dr.Miller and return call with further recommendations. Patient is agreeable.  Dr.Miller, okay to send in Diflucan 150 mg po once, repeat in 72 hours if symptoms persist #2 0RF?

## 2016-06-12 NOTE — Telephone Encounter (Signed)
Spoke with patient. Advised patient rx for Diflucan 150 mg take 1 tablet po now, repeat in 72 hours if symptoms persist #2 0RF has been sent to her pharmacy on file. Patient verbalizes understanding.  Routing to provider for final review. Patient agreeable to disposition. Will close encounter.

## 2016-06-12 NOTE — Telephone Encounter (Signed)
Left message to call Susan West at [phone]. 

## 2016-06-12 NOTE — Telephone Encounter (Signed)
Ok to send in Rx for pt for Diflucan 150mg  po x 1, repeat 72 hrs.  #2/0RF

## 2016-07-09 ENCOUNTER — Telehealth: Payer: Self-pay | Admitting: Obstetrics & Gynecology

## 2016-07-09 MED ORDER — FLUCONAZOLE 150 MG PO TABS: 150.0000 mg | ORAL_TABLET | Freq: Once | ORAL | 0 refills | Status: AC

## 2016-07-09 NOTE — Telephone Encounter (Signed)
Ok to prescribe diflucan 150mg  po x 1, repeat in 72 hours.  She will need to be seen for next rx.  Thanks.

## 2016-07-09 NOTE — Telephone Encounter (Signed)
Patient has discovered that she is allergic to doxycycline and the antibiotic gave her a yeast infection. Patient is asking if a prescription for Fluconazole to the pharmacy on file. Patient is asking to try this prescription before coming in for an appointment if possible.

## 2016-07-09 NOTE — Telephone Encounter (Signed)
Spoke with patient. Patient states she is allergic to the doxycycline prescribed for her acne. Patient states it is starting to give me an infection. Patient states she had stopped the doxycycline but her face flared up again really bad and she needed to take the medication one more time. Patient states she now has an appointment with dermatology. Patient states she has lower back pain and at night her stomach hurts. Patient reports vaginal irritation followed by cottage cheese colored vaginal discharge that started 2 days ago. Patient states she does not want to come into office since she is still paying lab bills from previous visits. Patient states she would like to know if Dr. Sabra Heck can send in diflucan one more time? Advised patient will review with Dr. Sabra Heck and return call with recommendations. Advised patient Dr. Sabra Heck is seeing patients, response may not be immediate. Patient is agreeable.  Dr. Sabra Heck, please advise?

## 2016-07-09 NOTE — Telephone Encounter (Signed)
Spoke with patient, advised as seen below per Dr. Miller. Patient verbalizes understanding and is agreeable. Patient thankful for return call.   Routing to provider for final review. Patient is agreeable to disposition. Will close encounter.  

## 2016-09-16 DIAGNOSIS — J028 Acute pharyngitis due to other specified organisms: Secondary | ICD-10-CM | POA: Diagnosis not present

## 2016-09-16 DIAGNOSIS — J301 Allergic rhinitis due to pollen: Secondary | ICD-10-CM | POA: Diagnosis not present

## 2016-09-17 DIAGNOSIS — J3089 Other allergic rhinitis: Secondary | ICD-10-CM | POA: Diagnosis not present

## 2016-12-03 DIAGNOSIS — J301 Allergic rhinitis due to pollen: Secondary | ICD-10-CM | POA: Diagnosis not present

## 2016-12-03 DIAGNOSIS — J4520 Mild intermittent asthma, uncomplicated: Secondary | ICD-10-CM | POA: Diagnosis not present

## 2016-12-03 DIAGNOSIS — J3089 Other allergic rhinitis: Secondary | ICD-10-CM | POA: Diagnosis not present

## 2016-12-03 DIAGNOSIS — Z91018 Allergy to other foods: Secondary | ICD-10-CM | POA: Diagnosis not present

## 2016-12-23 ENCOUNTER — Encounter: Payer: Self-pay | Admitting: Obstetrics & Gynecology

## 2016-12-23 ENCOUNTER — Ambulatory Visit (INDEPENDENT_AMBULATORY_CARE_PROVIDER_SITE_OTHER): Payer: BLUE CROSS/BLUE SHIELD | Admitting: Obstetrics & Gynecology

## 2016-12-23 VITALS — BP 104/62 | HR 84 | Resp 14 | Ht 62.5 in | Wt 137.0 lb

## 2016-12-23 DIAGNOSIS — Z01419 Encounter for gynecological examination (general) (routine) without abnormal findings: Secondary | ICD-10-CM | POA: Diagnosis not present

## 2016-12-23 NOTE — Progress Notes (Signed)
46 y.o. [id number] DivorcedCaucasianF here for annual exam.  Doing well.  Denies vaginal bleeding.  Work and children are doing well.  Her father has been fighting prostate cancer.  There is a question about whether his cancer is caused by a genetic abnormality.  Pt states it has been recommended that she have genetic testing.  This will be done at [facility].    Not seeing anyone right now.  Not SA.  No LMP recorded. Patient is not currently having periods (Reason: IUD).          Sexually active: No.  The current method of family planning is Mirena IUD placed 12/21/2015.  Exercising: Yes.    walk, run, elliptical  Smoker:  no  Health Maintenance: Pap:  09/30/14 Neg. HR HPV:neg   03/04/12 Neg  History of abnormal Pap:  no MMG:  01/30/16 BIRADS1:neg  Colonoscopy:  Never BMD:  Heel test 2006  TDaP:  Contraindicated  Pneumonia vaccine(s):  N/A Zostavax:   N/A Hep C testing: N/A Screening Labs: Not needed today   reports that she has never smoked. She has never used smokeless tobacco. She reports that she drinks alcohol. She reports that she does not use drugs.  Past Medical History:  Diagnosis Date  . Acne    adult-on doxycycline daily  . ADD (attention deficit disorder)   . Allergy   . Asthma    EXERCISE INDUCED  . Depression 2006   and anxiety  . Environmental allergies   . Headache, migraine   . history of abnormal Pap smear       . IUD   . Osteopenia 2006  . PONV (postoperative nausea and vomiting)   . Seasonal allergies     Past Surgical History:  Procedure Laterality Date  . ABDOMINAL HYSTERECTOMY    . AUGMENTATION MAMMAPLASTY Bilateral 01/2001  . BREAST ENHANCEMENT SURGERY  2002   bilateral implants  . CESAREAN SECTION  06/2000  . COSMETIC SURGERY    . INTRAUTERINE DEVICE (IUD) INSERTION     mirena  . LAPAROTOMY  04/21/2012   Procedure: EXPLORATORY LAPAROTOMY;  Surgeon: Janie Morning, MD PHD;  Location: [facility];  Service: Gynecology;  Laterality: N/A;  . NASAL SINUS  SURGERY     1999  . SALPINGOOPHORECTOMY  04/21/2012   Procedure: SALPINGO OOPHERECTOMY;  Surgeon: Janie Morning, MD PHD;  Location: [facility];  Service: Gynecology;  Laterality: Right;    Current Outpatient Prescriptions  Medication Sig Dispense Refill  . azelastine (ASTELIN) 0.1 % nasal spray Place into both nostrils 2 (two) times daily. Use in each nostril as directed    . cetirizine (ZYRTEC) 10 MG tablet Take 10 mg by mouth daily.    . fluticasone (FLONASE) 50 MCG/ACT nasal spray   4  . levonorgestrel (MIRENA) 20 MCG/24HR IUD 1 each by Intrauterine route once.    . Nutritional Supplements (JUICE PLUS FIBRE PO) Take by mouth.    . rizatriptan (MAXALT) 10 MG tablet TAKE 1 TABLET (10 MG TOTAL) BY MOUTH AS NEEDED FOR MIGRAINE. MAY REPEAT IN 2 HOURS IF NEEDED 10 tablet 1   No current facility-administered medications for this visit.     Family History  Problem Relation Age of Onset  . Cancer Father        prostate, bone  . Hypertension Maternal Grandmother   . Hypertension Paternal Grandfather   . Heart attack Paternal Grandfather   . Hypertension Paternal Grandmother   . Hyperlipidemia Sister   . Breast cancer Neg Hx  ROS:  Pertinent items are noted in HPI.  Otherwise, a comprehensive ROS was negative.  Exam:   BP 104/62 (BP Location: Right Arm, Patient Position: Sitting, Cuff Size: Normal)   Pulse 84   Resp 14   Ht 5' 2.5" (1.588 m)   Wt 137 lb (62.1 kg)   BMI 24.66 kg/m   Weight change: +12#   Height: 5' 2.5" (158.8 cm)  Ht Readings from Last 3 Encounters:  12/23/16 5' 2.5" (1.588 m)  12/12/15 5' 2.75" (1.594 m)  12/07/15 5' 2.25" (1.581 m)    General appearance: alert, cooperative and appears stated age Head: Normocephalic, without obvious abnormality, atraumatic Neck: no adenopathy, supple, symmetrical, trachea midline and thyroid normal to inspection and palpation Lungs: clear to auscultation bilaterally Breasts: normal appearance, no masses or tenderness Heart:  regular rate and rhythm Abdomen: soft, non-tender; bowel sounds normal; no masses,  no organomegaly Extremities: extremities normal, atraumatic, no cyanosis or edema Skin: Skin color, texture, turgor normal. No rashes or lesions Lymph nodes: Cervical, supraclavicular, and axillary nodes normal. No abnormal inguinal nodes palpated Neurologic: Grossly normal   Pelvic: External genitalia:  no lesions              Urethra:  normal appearing urethra with no masses, tenderness or lesions              Bartholins and Skenes: normal                 Vagina: normal appearing vagina with normal color and discharge, no lesions              Cervix: no lesions, IUD string noted              Pap taken: No. Bimanual Exam:  Uterus:  normal size, contour, position, consistency, mobility, non-tender              Adnexa: normal adnexa and no mass, fullness, tenderness               Rectovaginal: Confirms               Anus:  normal sphincter tone, no lesions  Chaperone was present for exam.  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  Plan to repeat next year. Lab work and vaccines are UDT Return annually or prn

## 2017-01-07 ENCOUNTER — Other Ambulatory Visit: Payer: Self-pay | Admitting: Obstetrics & Gynecology

## 2017-01-07 DIAGNOSIS — Z1231 Encounter for screening mammogram for malignant neoplasm of breast: Secondary | ICD-10-CM

## 2017-01-14 DIAGNOSIS — J301 Allergic rhinitis due to pollen: Secondary | ICD-10-CM | POA: Diagnosis not present

## 2017-01-14 DIAGNOSIS — J3089 Other allergic rhinitis: Secondary | ICD-10-CM | POA: Diagnosis not present

## 2017-01-19 DIAGNOSIS — Z1501 Genetic susceptibility to malignant neoplasm of breast: Secondary | ICD-10-CM

## 2017-01-19 DIAGNOSIS — Z1509 Genetic susceptibility to other malignant neoplasm: Secondary | ICD-10-CM

## 2017-01-19 HISTORY — DX: Genetic susceptibility to other malignant neoplasm: Z15.09

## 2017-01-19 HISTORY — DX: Genetic susceptibility to malignant neoplasm of breast: Z15.01

## 2017-01-30 ENCOUNTER — Other Ambulatory Visit: Payer: Self-pay | Admitting: Obstetrics & Gynecology

## 2017-01-30 DIAGNOSIS — G43709 Chronic migraine without aura, not intractable, without status migrainosus: Secondary | ICD-10-CM

## 2017-01-30 NOTE — Telephone Encounter (Signed)
Medication refill request: maxalt 10mg  Last AEX:  12-07-15 Next AEX: 12-25-17 Last MMG (if hormonal medication request): n/a Refill authorized: requesting refill. Please approve if appropriate

## 2017-02-03 ENCOUNTER — Ambulatory Visit
Admission: RE | Admit: 2017-02-03 | Discharge: 2017-02-03 | Disposition: A | Discharge status: Home / Self Care | Payer: BLUE CROSS/BLUE SHIELD | Source: Ambulatory Visit | Attending: Obstetrics & Gynecology | Admitting: Obstetrics & Gynecology

## 2017-02-03 DIAGNOSIS — Z1231 Encounter for screening mammogram for malignant neoplasm of breast: Secondary | ICD-10-CM | POA: Insufficient documentation

## 2017-03-04 DIAGNOSIS — M542 Cervicalgia: Secondary | ICD-10-CM | POA: Diagnosis not present

## 2017-03-04 DIAGNOSIS — G5621 Lesion of ulnar nerve, right upper limb: Secondary | ICD-10-CM | POA: Diagnosis not present

## 2017-03-05 DIAGNOSIS — M542 Cervicalgia: Secondary | ICD-10-CM | POA: Diagnosis not present

## 2017-03-05 DIAGNOSIS — M50223 Other cervical disc displacement at C6-C7 level: Secondary | ICD-10-CM | POA: Diagnosis not present

## 2017-03-10 DIAGNOSIS — G5621 Lesion of ulnar nerve, right upper limb: Secondary | ICD-10-CM | POA: Diagnosis not present

## 2017-03-10 DIAGNOSIS — M542 Cervicalgia: Secondary | ICD-10-CM | POA: Diagnosis not present

## 2017-03-14 ENCOUNTER — Ambulatory Visit: Payer: BLUE CROSS/BLUE SHIELD | Admitting: Obstetrics & Gynecology

## 2017-03-17 DIAGNOSIS — M542 Cervicalgia: Secondary | ICD-10-CM | POA: Diagnosis not present

## 2017-03-20 DIAGNOSIS — M542 Cervicalgia: Secondary | ICD-10-CM | POA: Diagnosis not present

## 2017-03-21 DIAGNOSIS — Z1509 Genetic susceptibility to other malignant neoplasm: Secondary | ICD-10-CM

## 2017-03-21 DIAGNOSIS — Z1501 Genetic susceptibility to malignant neoplasm of breast: Secondary | ICD-10-CM | POA: Insufficient documentation

## 2017-03-26 DIAGNOSIS — G5602 Carpal tunnel syndrome, left upper limb: Secondary | ICD-10-CM | POA: Diagnosis not present

## 2017-03-26 DIAGNOSIS — R200 Anesthesia of skin: Secondary | ICD-10-CM | POA: Diagnosis not present

## 2017-03-26 DIAGNOSIS — M542 Cervicalgia: Secondary | ICD-10-CM | POA: Diagnosis not present

## 2017-03-27 DIAGNOSIS — H5202 Hypermetropia, left eye: Secondary | ICD-10-CM | POA: Diagnosis not present

## 2017-03-31 DIAGNOSIS — M542 Cervicalgia: Secondary | ICD-10-CM | POA: Diagnosis not present

## 2017-04-02 DIAGNOSIS — M542 Cervicalgia: Secondary | ICD-10-CM | POA: Diagnosis not present

## 2017-04-07 DIAGNOSIS — M542 Cervicalgia: Secondary | ICD-10-CM | POA: Diagnosis not present

## 2017-04-14 DIAGNOSIS — M542 Cervicalgia: Secondary | ICD-10-CM | POA: Diagnosis not present

## 2017-04-17 DIAGNOSIS — Z1501 Genetic susceptibility to malignant neoplasm of breast: Secondary | ICD-10-CM | POA: Diagnosis not present

## 2017-04-17 DIAGNOSIS — M542 Cervicalgia: Secondary | ICD-10-CM | POA: Diagnosis not present

## 2017-04-17 DIAGNOSIS — Z87898 Personal history of other specified conditions: Secondary | ICD-10-CM | POA: Diagnosis not present

## 2017-04-17 DIAGNOSIS — Z1509 Genetic susceptibility to other malignant neoplasm: Secondary | ICD-10-CM | POA: Diagnosis not present

## 2017-04-17 DIAGNOSIS — Z09 Encounter for follow-up examination after completed treatment for conditions other than malignant neoplasm: Secondary | ICD-10-CM | POA: Diagnosis not present

## 2017-04-22 DIAGNOSIS — M542 Cervicalgia: Secondary | ICD-10-CM | POA: Diagnosis not present

## 2017-04-22 DIAGNOSIS — G5602 Carpal tunnel syndrome, left upper limb: Secondary | ICD-10-CM | POA: Diagnosis not present

## 2017-04-23 DIAGNOSIS — L853 Xerosis cutis: Secondary | ICD-10-CM | POA: Diagnosis not present

## 2017-04-23 DIAGNOSIS — L820 Inflamed seborrheic keratosis: Secondary | ICD-10-CM | POA: Diagnosis not present

## 2017-04-23 DIAGNOSIS — M542 Cervicalgia: Secondary | ICD-10-CM | POA: Diagnosis not present

## 2017-04-23 DIAGNOSIS — L570 Actinic keratosis: Secondary | ICD-10-CM | POA: Diagnosis not present

## 2017-04-24 DIAGNOSIS — M542 Cervicalgia: Secondary | ICD-10-CM | POA: Diagnosis not present

## 2017-04-28 DIAGNOSIS — M542 Cervicalgia: Secondary | ICD-10-CM | POA: Diagnosis not present

## 2017-05-01 DIAGNOSIS — M542 Cervicalgia: Secondary | ICD-10-CM | POA: Diagnosis not present

## 2017-05-05 DIAGNOSIS — J301 Allergic rhinitis due to pollen: Secondary | ICD-10-CM | POA: Diagnosis not present

## 2017-05-06 DIAGNOSIS — J3089 Other allergic rhinitis: Secondary | ICD-10-CM | POA: Diagnosis not present

## 2017-05-08 DIAGNOSIS — M542 Cervicalgia: Secondary | ICD-10-CM | POA: Diagnosis not present

## 2017-05-12 DIAGNOSIS — M542 Cervicalgia: Secondary | ICD-10-CM | POA: Diagnosis not present

## 2017-05-15 DIAGNOSIS — M542 Cervicalgia: Secondary | ICD-10-CM | POA: Diagnosis not present

## 2017-05-19 DIAGNOSIS — M542 Cervicalgia: Secondary | ICD-10-CM | POA: Diagnosis not present

## 2017-05-22 DIAGNOSIS — M542 Cervicalgia: Secondary | ICD-10-CM | POA: Diagnosis not present

## 2017-05-26 DIAGNOSIS — M542 Cervicalgia: Secondary | ICD-10-CM | POA: Diagnosis not present

## 2017-05-29 DIAGNOSIS — Z1509 Genetic susceptibility to other malignant neoplasm: Secondary | ICD-10-CM | POA: Diagnosis not present

## 2017-05-29 DIAGNOSIS — M542 Cervicalgia: Secondary | ICD-10-CM | POA: Diagnosis not present

## 2017-05-29 DIAGNOSIS — R922 Inconclusive mammogram: Secondary | ICD-10-CM | POA: Diagnosis not present

## 2017-05-29 DIAGNOSIS — Z803 Family history of malignant neoplasm of breast: Secondary | ICD-10-CM | POA: Diagnosis not present

## 2017-05-29 DIAGNOSIS — Z1231 Encounter for screening mammogram for malignant neoplasm of breast: Secondary | ICD-10-CM | POA: Diagnosis not present

## 2017-05-29 DIAGNOSIS — Z1501 Genetic susceptibility to malignant neoplasm of breast: Secondary | ICD-10-CM | POA: Diagnosis not present

## 2017-06-02 DIAGNOSIS — M542 Cervicalgia: Secondary | ICD-10-CM | POA: Diagnosis not present

## 2017-06-05 ENCOUNTER — Encounter: Payer: Self-pay | Admitting: Obstetrics & Gynecology

## 2017-06-05 ENCOUNTER — Other Ambulatory Visit: Payer: Self-pay | Admitting: *Deleted

## 2017-06-05 ENCOUNTER — Telehealth: Payer: Self-pay | Admitting: Obstetrics & Gynecology

## 2017-06-05 DIAGNOSIS — M542 Cervicalgia: Secondary | ICD-10-CM | POA: Diagnosis not present

## 2017-06-05 DIAGNOSIS — Z1509 Genetic susceptibility to other malignant neoplasm: Principal | ICD-10-CM

## 2017-06-05 DIAGNOSIS — Z1502 Genetic susceptibility to malignant neoplasm of ovary: Principal | ICD-10-CM

## 2017-06-05 DIAGNOSIS — Z1501 Genetic susceptibility to malignant neoplasm of breast: Secondary | ICD-10-CM

## 2017-06-05 NOTE — Telephone Encounter (Signed)
Patient returning a call from Dr Sabra Heck.

## 2017-06-05 NOTE — Telephone Encounter (Signed)
Called pt.  She was recently diagnosed with BRCA1 gene mutation.  Has breast MRI scheduled next week at [facility].  Does not want to do any risk reduction surgery at this time.  Recommended PUS and Ca-125 now and yearly screening as per current recommendations.  Pt comfortable with this.  Dates discussed.  Scheduled for 11/27 at 4pm.  Ok to close encounter. 

## 2017-06-05 NOTE — Telephone Encounter (Signed)
Spoke with patient. Patient states that [place] left a message on her voicemail to return call. Advised Dr.Miller is in a meeting at this time. Will send a message to her to let her know she returned the call. Patient is agreeable.

## 2017-06-09 DIAGNOSIS — L309 Dermatitis, unspecified: Secondary | ICD-10-CM | POA: Diagnosis not present

## 2017-06-09 DIAGNOSIS — L570 Actinic keratosis: Secondary | ICD-10-CM | POA: Diagnosis not present

## 2017-06-09 DIAGNOSIS — L700 Acne vulgaris: Secondary | ICD-10-CM | POA: Diagnosis not present

## 2017-06-10 DIAGNOSIS — M542 Cervicalgia: Secondary | ICD-10-CM | POA: Diagnosis not present

## 2017-06-11 DIAGNOSIS — N6311 Unspecified lump in the right breast, upper outer quadrant: Secondary | ICD-10-CM | POA: Diagnosis not present

## 2017-06-11 DIAGNOSIS — Z1509 Genetic susceptibility to other malignant neoplasm: Secondary | ICD-10-CM | POA: Diagnosis not present

## 2017-06-11 DIAGNOSIS — Z1501 Genetic susceptibility to malignant neoplasm of breast: Secondary | ICD-10-CM | POA: Diagnosis not present

## 2017-06-11 DIAGNOSIS — Z1231 Encounter for screening mammogram for malignant neoplasm of breast: Secondary | ICD-10-CM | POA: Diagnosis not present

## 2017-06-11 DIAGNOSIS — Z803 Family history of malignant neoplasm of breast: Secondary | ICD-10-CM | POA: Diagnosis not present

## 2017-06-11 DIAGNOSIS — R922 Inconclusive mammogram: Secondary | ICD-10-CM | POA: Diagnosis not present

## 2017-06-11 DIAGNOSIS — Z1239 Encounter for other screening for malignant neoplasm of breast: Secondary | ICD-10-CM | POA: Diagnosis not present

## 2017-06-11 DIAGNOSIS — N6312 Unspecified lump in the right breast, upper inner quadrant: Secondary | ICD-10-CM | POA: Diagnosis not present

## 2017-06-13 DIAGNOSIS — M542 Cervicalgia: Secondary | ICD-10-CM | POA: Diagnosis not present

## 2017-06-17 ENCOUNTER — Encounter: Payer: Self-pay | Admitting: Obstetrics & Gynecology

## 2017-06-17 ENCOUNTER — Ambulatory Visit (INDEPENDENT_AMBULATORY_CARE_PROVIDER_SITE_OTHER): Payer: BLUE CROSS/BLUE SHIELD

## 2017-06-17 ENCOUNTER — Ambulatory Visit (INDEPENDENT_AMBULATORY_CARE_PROVIDER_SITE_OTHER): Payer: BLUE CROSS/BLUE SHIELD | Admitting: Obstetrics & Gynecology

## 2017-06-17 DIAGNOSIS — Z1501 Genetic susceptibility to malignant neoplasm of breast: Secondary | ICD-10-CM | POA: Diagnosis not present

## 2017-06-17 DIAGNOSIS — Z1509 Genetic susceptibility to other malignant neoplasm: Secondary | ICD-10-CM

## 2017-06-17 DIAGNOSIS — M542 Cervicalgia: Secondary | ICD-10-CM | POA: Diagnosis not present

## 2017-06-17 DIAGNOSIS — Z1502 Genetic susceptibility to malignant neoplasm of ovary: Secondary | ICD-10-CM | POA: Diagnosis not present

## 2017-06-17 NOTE — Progress Notes (Signed)
46 y.o. [id number] Divorced Caucasian female here for pelvic ultrasound due to recent diagnosis of BRCA 1 genetic abnormality.  Has been seen at [facility].  Feeling very pressured to have prophylactic surgery and she just isn't ready to make this decision yet.  Had breast MRI completed at [facility].  Is going back for guided biopsy.  Report does really make this abnormality sound benign.  Pt isn't feeling worried.  PUS and ca-125 will be done today.  No LMP recorded. Patient is not currently having periods (Reason: IUD).  Contraception: Mirena IUD  Findings:  UTERUS: 9.0 x 7.0 x 5.0cm EMS:4.84m ADNEXA: Left ovary:  3.5 x 2.0 x 17.0YF follicles noted       Right ovary: surgically absent CUL DE SAC: no free fluid  Discussion:  Findings reviewed with pt.  Ovary appears normal.  Recommendations for prophylactic ovary/tube removal vs hysterectomy and ovary/tube removal discussed.  Pt is aware that this recommendation is made bewteen [id number] old and/or when completed child bearing.  As she is done with this, timing would be appropriate to proceed.  Pt is very nervous about being menopausal and about what types of products she could use.  Reviewed with today today.  She knows she is really past age where surgery should be done.  She just is not ready to made decison about surgery at this time.  She is also aware she may be putting herself at increased risk for cancers but willing to take this risk at this time as she feels this will help her make a better decision in the long run.  Assessment:  BRCA 1 gene positive testing  Plan:  Ca-125 will be obtained.  Pt will return for repeat PUS and ca-125 in six months.  ~30 minutes spent with patient >50% of time was in face to face discussion of above.

## 2017-06-18 LAB — CA 125: CANCER ANTIGEN (CA) 125: 14.4 U/mL (ref 0.0–38.1)

## 2017-06-19 DIAGNOSIS — M542 Cervicalgia: Secondary | ICD-10-CM | POA: Diagnosis not present

## 2017-06-24 DIAGNOSIS — N6091 Unspecified benign mammary dysplasia of right breast: Secondary | ICD-10-CM | POA: Diagnosis not present

## 2017-06-24 DIAGNOSIS — Z9189 Other specified personal risk factors, not elsewhere classified: Secondary | ICD-10-CM | POA: Diagnosis not present

## 2017-06-24 DIAGNOSIS — N6489 Other specified disorders of breast: Secondary | ICD-10-CM | POA: Diagnosis not present

## 2017-06-24 DIAGNOSIS — Z1509 Genetic susceptibility to other malignant neoplasm: Secondary | ICD-10-CM | POA: Diagnosis not present

## 2017-06-24 DIAGNOSIS — Z9889 Other specified postprocedural states: Secondary | ICD-10-CM | POA: Diagnosis not present

## 2017-06-24 DIAGNOSIS — N6041 Mammary duct ectasia of right breast: Secondary | ICD-10-CM | POA: Diagnosis not present

## 2017-06-24 DIAGNOSIS — Z1501 Genetic susceptibility to malignant neoplasm of breast: Secondary | ICD-10-CM | POA: Diagnosis not present

## 2017-06-24 DIAGNOSIS — N6011 Diffuse cystic mastopathy of right breast: Secondary | ICD-10-CM | POA: Diagnosis not present

## 2017-06-24 DIAGNOSIS — Z803 Family history of malignant neoplasm of breast: Secondary | ICD-10-CM | POA: Diagnosis not present

## 2017-06-24 DIAGNOSIS — R922 Inconclusive mammogram: Secondary | ICD-10-CM | POA: Diagnosis not present

## 2017-06-24 DIAGNOSIS — R928 Other abnormal and inconclusive findings on diagnostic imaging of breast: Secondary | ICD-10-CM | POA: Diagnosis not present

## 2017-06-24 DIAGNOSIS — Z006 Encounter for examination for normal comparison and control in clinical research program: Secondary | ICD-10-CM | POA: Diagnosis not present

## 2017-07-31 DIAGNOSIS — Z1273 Encounter for screening for malignant neoplasm of ovary: Secondary | ICD-10-CM | POA: Diagnosis not present

## 2017-07-31 DIAGNOSIS — Z1509 Genetic susceptibility to other malignant neoplasm: Secondary | ICD-10-CM | POA: Diagnosis not present

## 2017-07-31 DIAGNOSIS — R32 Unspecified urinary incontinence: Secondary | ICD-10-CM | POA: Diagnosis not present

## 2017-07-31 DIAGNOSIS — Z1501 Genetic susceptibility to malignant neoplasm of breast: Secondary | ICD-10-CM | POA: Diagnosis not present

## 2017-08-04 DIAGNOSIS — M542 Cervicalgia: Secondary | ICD-10-CM | POA: Diagnosis not present

## 2017-08-07 ENCOUNTER — Telehealth: Payer: Self-pay | Admitting: *Deleted

## 2017-08-07 DIAGNOSIS — M542 Cervicalgia: Secondary | ICD-10-CM | POA: Diagnosis not present

## 2017-08-07 NOTE — Telephone Encounter (Signed)
Patient called and wanted a copy of her medical records. Faxed the patient a copy of there release form.

## 2017-08-08 ENCOUNTER — Telehealth: Payer: Self-pay | Admitting: *Deleted

## 2017-08-08 DIAGNOSIS — M542 Cervicalgia: Secondary | ICD-10-CM | POA: Diagnosis not present

## 2017-08-08 NOTE — Telephone Encounter (Signed)
Per patient request, faxed the chart to the patient. Also mailed a copy of the chart to the patient. Medical release was signed.

## 2017-08-15 DIAGNOSIS — N3946 Mixed incontinence: Secondary | ICD-10-CM | POA: Diagnosis not present

## 2017-08-15 DIAGNOSIS — N952 Postmenopausal atrophic vaginitis: Secondary | ICD-10-CM | POA: Diagnosis not present

## 2017-08-28 DIAGNOSIS — M542 Cervicalgia: Secondary | ICD-10-CM | POA: Diagnosis not present

## 2017-09-04 DIAGNOSIS — M542 Cervicalgia: Secondary | ICD-10-CM | POA: Diagnosis not present

## 2017-09-09 DIAGNOSIS — Z1509 Genetic susceptibility to other malignant neoplasm: Secondary | ICD-10-CM | POA: Diagnosis not present

## 2017-09-09 DIAGNOSIS — N83202 Unspecified ovarian cyst, left side: Secondary | ICD-10-CM | POA: Diagnosis not present

## 2017-09-09 DIAGNOSIS — Z1501 Genetic susceptibility to malignant neoplasm of breast: Secondary | ICD-10-CM | POA: Diagnosis not present

## 2017-10-20 DIAGNOSIS — M542 Cervicalgia: Secondary | ICD-10-CM | POA: Diagnosis not present

## 2017-10-27 DIAGNOSIS — J301 Allergic rhinitis due to pollen: Secondary | ICD-10-CM | POA: Diagnosis not present

## 2017-10-28 DIAGNOSIS — J3089 Other allergic rhinitis: Secondary | ICD-10-CM | POA: Diagnosis not present

## 2017-10-28 DIAGNOSIS — N83209 Unspecified ovarian cyst, unspecified side: Secondary | ICD-10-CM | POA: Diagnosis not present

## 2017-11-13 DIAGNOSIS — M542 Cervicalgia: Secondary | ICD-10-CM | POA: Diagnosis not present

## 2017-12-02 DIAGNOSIS — J301 Allergic rhinitis due to pollen: Secondary | ICD-10-CM | POA: Diagnosis not present

## 2017-12-02 DIAGNOSIS — Z91018 Allergy to other foods: Secondary | ICD-10-CM | POA: Diagnosis not present

## 2017-12-02 DIAGNOSIS — J3089 Other allergic rhinitis: Secondary | ICD-10-CM | POA: Diagnosis not present

## 2017-12-02 DIAGNOSIS — J4520 Mild intermittent asthma, uncomplicated: Secondary | ICD-10-CM | POA: Diagnosis not present

## 2017-12-04 DIAGNOSIS — M542 Cervicalgia: Secondary | ICD-10-CM | POA: Diagnosis not present

## 2017-12-11 DIAGNOSIS — N83209 Unspecified ovarian cyst, unspecified side: Secondary | ICD-10-CM | POA: Diagnosis not present

## 2017-12-11 DIAGNOSIS — Z1501 Genetic susceptibility to malignant neoplasm of breast: Secondary | ICD-10-CM | POA: Diagnosis not present

## 2017-12-11 DIAGNOSIS — Z1509 Genetic susceptibility to other malignant neoplasm: Secondary | ICD-10-CM | POA: Diagnosis not present

## 2017-12-17 ENCOUNTER — Telehealth: Payer: Self-pay | Admitting: Obstetrics & Gynecology

## 2017-12-17 NOTE — Telephone Encounter (Signed)
Spoke with patient. Has decided to proceed with hysterectomy through [facility], tentatively scheduled for 03/06/18.  PUS on 09/15/17 and 10/28/17 at [facility], available for review in [place].   Advised patient to keep AEX as scheduled with Dr. Sabra Heck for 12/25/17, can repeat CA-125 at that time if needed. Advised Dr. Sabra Heck will review, I will return call with any additional recommendations.   Routing to provider for final review. Patient is agreeable to disposition. Will close encounter.

## 2017-12-17 NOTE — Telephone Encounter (Signed)
Patient called to confirm with the nurse she still needs her AEX this year. She is scheduled for 12/25/17 with Dr. Sabra Heck. She said she has surgery scheduled at [facility] tentatively on 03/06/18.

## 2017-12-17 NOTE — Telephone Encounter (Signed)
Left message to call Sharee Pimple at [phone].  AEX recommended, last AEX 12/23/16, next 12/25/17.  Needs 30mo  f/u PUS and CA-125

## 2017-12-18 DIAGNOSIS — M542 Cervicalgia: Secondary | ICD-10-CM | POA: Diagnosis not present

## 2017-12-18 NOTE — Telephone Encounter (Signed)
It has been three years since her last pap smear so I would repeat this prior to her surgery.  I did review her ultrasound and I don't think she needs this again.  She and I can discuss the ca-125 when she comes.  I'd like her to keep the appt if she is comfortable with coming.  Thanks.

## 2017-12-19 NOTE — Telephone Encounter (Signed)
Left detailed message, name on voicemail, ok per dpr. Advised as seen below, return call to office if any additional questions. Encounter closed.

## 2017-12-23 DIAGNOSIS — M542 Cervicalgia: Secondary | ICD-10-CM | POA: Diagnosis not present

## 2017-12-25 ENCOUNTER — Other Ambulatory Visit: Payer: Self-pay

## 2017-12-25 ENCOUNTER — Other Ambulatory Visit (HOSPITAL_COMMUNITY)
Admission: RE | Admit: 2017-12-25 | Discharge: 2017-12-25 | Disposition: A | Discharge status: Home / Self Care | Payer: BLUE CROSS/BLUE SHIELD | Source: Ambulatory Visit | Attending: Obstetrics & Gynecology | Admitting: Obstetrics & Gynecology

## 2017-12-25 ENCOUNTER — Ambulatory Visit (INDEPENDENT_AMBULATORY_CARE_PROVIDER_SITE_OTHER): Payer: BLUE CROSS/BLUE SHIELD | Admitting: Obstetrics & Gynecology

## 2017-12-25 ENCOUNTER — Encounter: Payer: Self-pay | Admitting: Obstetrics & Gynecology

## 2017-12-25 VITALS — BP 110/70 | HR 84 | Resp 16 | Ht 62.75 in | Wt 129.0 lb

## 2017-12-25 DIAGNOSIS — Z1211 Encounter for screening for malignant neoplasm of colon: Secondary | ICD-10-CM

## 2017-12-25 DIAGNOSIS — E755 Other lipid storage disorders: Secondary | ICD-10-CM | POA: Diagnosis not present

## 2017-12-25 DIAGNOSIS — Z124 Encounter for screening for malignant neoplasm of cervix: Secondary | ICD-10-CM | POA: Insufficient documentation

## 2017-12-25 DIAGNOSIS — Z01419 Encounter for gynecological examination (general) (routine) without abnormal findings: Secondary | ICD-10-CM

## 2017-12-25 DIAGNOSIS — Z0000 Encounter for general adult medical examination without abnormal findings: Secondary | ICD-10-CM | POA: Diagnosis not present

## 2017-12-25 DIAGNOSIS — E559 Vitamin D deficiency, unspecified: Secondary | ICD-10-CM | POA: Diagnosis not present

## 2017-12-25 NOTE — Progress Notes (Addendum)
47 y.o. [id number] DivorcedCaucasianF here for annual exam.  She has decided to proceed with hysterectomy and oophorectomy.  This is going to done at [facility].  Recent ultrasound showed two cysts on her left ovary.    Cycles are regular.  Feels like she is having more PMS symptoms.  She's having some increased bloating.  Surgery is schedule for August.    Reports her dad's cancer has returned and this is what made her change her mind about surgery.    Patient's last menstrual period was 12/08/2017 (within days).          Sexually active: No.  The current method of family planning is IUD. Mirena placed 12/21/15    Exercising: Yes.    physical therapy Smoker:  no  Health Maintenance: Pap:  09/30/14 Neg. HR HPV:neg History of abnormal Pap:  no MMG:  02/04/17 BIRADS1:Neg, MRI was 11/18 at [facility] Colonoscopy:  Never.  D/w pt new guidelines from ACS. BMD:   Never TDaP:  Contraindicated  Screening Labs: discuss today   reports that she has never smoked. She has never used smokeless tobacco. She reports that she drinks alcohol. She reports that she has current or past drug history.  Past Medical History:  Diagnosis Date  . Acne    adult-on doxycycline daily  . ADD (attention deficit disorder)   . Allergy   . Asthma    EXERCISE INDUCED  . BRCA1 positive 01/2017  . Depression 2006   and anxiety  . Environmental allergies   . Headache, migraine   . history of abnormal Pap smear       . IUD   . Osteopenia 2006  . PONV (postoperative nausea and vomiting)   . Seasonal allergies     Past Surgical History:  Procedure Laterality Date  . AUGMENTATION MAMMAPLASTY Bilateral 01/2001  . BREAST ENHANCEMENT SURGERY  2002   bilateral implants  . CESAREAN SECTION  06/2000  . COSMETIC SURGERY    . INTRAUTERINE DEVICE (IUD) INSERTION     mirena  . LAPAROTOMY  04/21/2012   Procedure: EXPLORATORY LAPAROTOMY;  Surgeon: Janie Morning, MD PHD;  Location: [facility];  Service: Gynecology;  Laterality: N/A;  .  NASAL SINUS SURGERY     1999  . OOPHORECTOMY Right 2013   neg  . SALPINGOOPHORECTOMY  04/21/2012   Procedure: SALPINGO OOPHERECTOMY;  Surgeon: Janie Morning, MD PHD;  Location: [facility];  Service: Gynecology;  Laterality: Right;    Current Outpatient Medications  Medication Sig Dispense Refill  . azelastine (ASTELIN) 0.1 % nasal spray Place into both nostrils 2 (two) times daily. Use in each nostril as directed    . cetirizine (ZYRTEC) 10 MG tablet Take 10 mg by mouth daily.    . fluticasone (FLONASE) 50 MCG/ACT nasal spray   4  . gabapentin (NEURONTIN) 300 MG capsule TAKE 1 CAPSULE BY MOUTH EVERYDAY AT BEDTIME  0  . levonorgestrel (MIRENA) 20 MCG/24HR IUD 1 each by Intrauterine route once.    . Nutritional Supplements (JUICE PLUS FIBRE PO) Take by mouth.    . rizatriptan (MAXALT) 10 MG tablet TAKE 1 TABLET (10 MG TOTAL) BY MOUTH AS NEEDED FOR MIGRAINE. MAY REPEAT IN 2 HOURS IF NEEDED 10 tablet 1   No current facility-administered medications for this visit.     Family History  Problem Relation Age of Onset  . Cancer Father        prostate, bone  . Hypertension Maternal Grandmother   . Hypertension Paternal Grandfather   .  Heart attack Paternal Grandfather   . Hypertension Paternal Grandmother   . Hyperlipidemia Sister   . Breast cancer Neg Hx     Review of Systems  Constitutional:       Hair loss  HENT: Negative.   Eyes: Negative.   Respiratory: Negative.   Cardiovascular: Negative.   Gastrointestinal:       Pain Bloating Change in quality/character of stool  Genitourinary:       Unscheduled bleeding or spotting Loss of sexual interest Loss of urine spontaneously Loss of urine with sneeze or cough  Musculoskeletal: Negative.   Skin: Negative.   Neurological: Negative.   Endo/Heme/Allergies: Negative.   Psychiatric/Behavioral: Negative.     Exam:   BP 110/70 (BP Location: Right Arm, Patient Position: Sitting, Cuff Size: Normal)   Pulse 84   Resp 16   Ht 5'  2.75" (1.594 m)   Wt 129 lb (58.5 kg)   LMP 12/08/2017 (Within Days)   BMI 23.03 kg/m   Height: 5' 2.75" (159.4 cm)  Ht Readings from Last 3 Encounters:  12/25/17 5' 2.75" (1.594 m)  06/17/17 5' 2.5" (1.588 m)  12/23/16 5' 2.5" (1.588 m)    General appearance: alert, cooperative and appears stated age Head: Normocephalic, without obvious abnormality, atraumatic Neck: no adenopathy, supple, symmetrical, trachea midline and thyroid normal to inspection and palpation Lungs: clear to auscultation bilaterally Breasts: normal appearance, no masses or tenderness, bilateral implants Heart: regular rate and rhythm Abdomen: soft, non-tender; bowel sounds normal; no masses,  no organomegaly Extremities: extremities normal, atraumatic, no cyanosis or edema Skin: Skin color, texture, turgor normal. No rashes or lesions Lymph nodes: Cervical, supraclavicular, and axillary nodes normal. No abnormal inguinal nodes palpated Neurologic: Grossly normal   Pelvic: External genitalia:  no lesions              Urethra:  normal appearing urethra with no masses, tenderness or lesions              Bartholins and Skenes: normal                 Vagina: normal appearing vagina with normal color and discharge, no lesions              Cervix: no lesions and IUD string noted              Pap taken: Yes.   Bimanual Exam:  Uterus:  normal size, contour, position, consistency, mobility, non-tender              Adnexa: normal adnexa and no mass, fullness, tenderness               Rectovaginal: Confirms               Anus:  normal sphincter tone, no lesions  Chaperone was present for exam.  A:  Well Woman with normal exam H/O RSO due to mature teratoma 10/13 Mirena IUD placed 6/17 H/O ADD BRCA 1 gene mutation  P:   Mammogram guidelines reviewed.  MRI due 11/19. pap smear and HR HPV obtained Will check Lipids and Vit D today IFOB given Hysterectomy planned at [facility] 8/19 return annually or prn

## 2017-12-26 LAB — LIPID PANEL
CHOLESTEROL TOTAL: 185 mg/dL (ref 100–199)
Chol/HDL Ratio: 3 ratio (ref 0.0–4.4)
HDL: 61 mg/dL (ref >39)
LDL Calculated: 111 mg/dL — ABNORMAL HIGH (ref 0–99)
Triglycerides: 65 mg/dL (ref 0–149)
VLDL Cholesterol Cal: 13 mg/dL (ref 5–40)

## 2017-12-26 LAB — VITAMIN D 25 HYDROXY (VIT D DEFICIENCY, FRACTURES): Vit D, 25-Hydroxy: 46.4 ng/mL (ref 30.0–100.0)

## 2017-12-29 LAB — CYTOLOGY - PAP
Diagnosis: NEGATIVE
HPV: NOT DETECTED

## 2018-01-01 DIAGNOSIS — M542 Cervicalgia: Secondary | ICD-10-CM | POA: Diagnosis not present

## 2018-01-12 DIAGNOSIS — N393 Stress incontinence (female) (male): Secondary | ICD-10-CM | POA: Diagnosis not present

## 2018-01-12 DIAGNOSIS — N3281 Overactive bladder: Secondary | ICD-10-CM | POA: Diagnosis not present

## 2018-01-12 DIAGNOSIS — M62838 Other muscle spasm: Secondary | ICD-10-CM | POA: Diagnosis not present

## 2018-01-12 DIAGNOSIS — M545 Low back pain: Secondary | ICD-10-CM | POA: Diagnosis not present

## 2018-01-14 DIAGNOSIS — N3941 Urge incontinence: Secondary | ICD-10-CM | POA: Diagnosis not present

## 2018-01-14 DIAGNOSIS — M6281 Muscle weakness (generalized): Secondary | ICD-10-CM | POA: Diagnosis not present

## 2018-01-14 DIAGNOSIS — M62838 Other muscle spasm: Secondary | ICD-10-CM | POA: Diagnosis not present

## 2018-01-15 DIAGNOSIS — M542 Cervicalgia: Secondary | ICD-10-CM | POA: Diagnosis not present

## 2018-02-03 DIAGNOSIS — M542 Cervicalgia: Secondary | ICD-10-CM | POA: Diagnosis not present

## 2018-02-04 DIAGNOSIS — M6281 Muscle weakness (generalized): Secondary | ICD-10-CM | POA: Diagnosis not present

## 2018-02-04 DIAGNOSIS — N3941 Urge incontinence: Secondary | ICD-10-CM | POA: Diagnosis not present

## 2018-02-04 DIAGNOSIS — M62838 Other muscle spasm: Secondary | ICD-10-CM | POA: Diagnosis not present

## 2018-02-05 DIAGNOSIS — Z1231 Encounter for screening mammogram for malignant neoplasm of breast: Secondary | ICD-10-CM | POA: Diagnosis not present

## 2018-02-05 DIAGNOSIS — R922 Inconclusive mammogram: Secondary | ICD-10-CM | POA: Diagnosis not present

## 2018-02-05 DIAGNOSIS — Z1501 Genetic susceptibility to malignant neoplasm of breast: Secondary | ICD-10-CM | POA: Diagnosis not present

## 2018-02-05 DIAGNOSIS — H01139 Eczematous dermatitis of unspecified eye, unspecified eyelid: Secondary | ICD-10-CM | POA: Diagnosis not present

## 2018-02-05 DIAGNOSIS — Z803 Family history of malignant neoplasm of breast: Secondary | ICD-10-CM | POA: Diagnosis not present

## 2018-02-05 DIAGNOSIS — R21 Rash and other nonspecific skin eruption: Secondary | ICD-10-CM | POA: Diagnosis not present

## 2018-02-05 DIAGNOSIS — N83209 Unspecified ovarian cyst, unspecified side: Secondary | ICD-10-CM | POA: Diagnosis not present

## 2018-02-05 DIAGNOSIS — N6489 Other specified disorders of breast: Secondary | ICD-10-CM | POA: Diagnosis not present

## 2018-02-05 DIAGNOSIS — Z5181 Encounter for therapeutic drug level monitoring: Secondary | ICD-10-CM | POA: Diagnosis not present

## 2018-02-05 DIAGNOSIS — Z79899 Other long term (current) drug therapy: Secondary | ICD-10-CM | POA: Diagnosis not present

## 2018-02-05 DIAGNOSIS — L719 Rosacea, unspecified: Secondary | ICD-10-CM | POA: Diagnosis not present

## 2018-02-05 DIAGNOSIS — L905 Scar conditions and fibrosis of skin: Secondary | ICD-10-CM | POA: Diagnosis not present

## 2018-02-05 DIAGNOSIS — Z1509 Genetic susceptibility to other malignant neoplasm: Secondary | ICD-10-CM | POA: Diagnosis not present

## 2018-02-05 DIAGNOSIS — Z9882 Breast implant status: Secondary | ICD-10-CM | POA: Diagnosis not present

## 2018-02-12 DIAGNOSIS — L708 Other acne: Secondary | ICD-10-CM | POA: Diagnosis not present

## 2018-02-12 DIAGNOSIS — L259 Unspecified contact dermatitis, unspecified cause: Secondary | ICD-10-CM | POA: Diagnosis not present

## 2018-02-16 DIAGNOSIS — M542 Cervicalgia: Secondary | ICD-10-CM | POA: Diagnosis not present

## 2018-02-17 DIAGNOSIS — L259 Unspecified contact dermatitis, unspecified cause: Secondary | ICD-10-CM | POA: Diagnosis not present

## 2018-02-17 DIAGNOSIS — H52223 Regular astigmatism, bilateral: Secondary | ICD-10-CM | POA: Diagnosis not present

## 2018-02-17 DIAGNOSIS — H524 Presbyopia: Secondary | ICD-10-CM | POA: Diagnosis not present

## 2018-02-19 DIAGNOSIS — L308 Other specified dermatitis: Secondary | ICD-10-CM | POA: Diagnosis not present

## 2018-02-19 DIAGNOSIS — L239 Allergic contact dermatitis, unspecified cause: Secondary | ICD-10-CM | POA: Diagnosis not present

## 2018-02-26 DIAGNOSIS — M6281 Muscle weakness (generalized): Secondary | ICD-10-CM | POA: Diagnosis not present

## 2018-02-26 DIAGNOSIS — L239 Allergic contact dermatitis, unspecified cause: Secondary | ICD-10-CM | POA: Diagnosis not present

## 2018-02-26 DIAGNOSIS — N3941 Urge incontinence: Secondary | ICD-10-CM | POA: Diagnosis not present

## 2018-02-26 DIAGNOSIS — M62838 Other muscle spasm: Secondary | ICD-10-CM | POA: Diagnosis not present

## 2018-02-27 DIAGNOSIS — M542 Cervicalgia: Secondary | ICD-10-CM | POA: Diagnosis not present

## 2018-03-03 DIAGNOSIS — M542 Cervicalgia: Secondary | ICD-10-CM | POA: Diagnosis not present

## 2018-03-04 DIAGNOSIS — M62838 Other muscle spasm: Secondary | ICD-10-CM | POA: Diagnosis not present

## 2018-03-04 DIAGNOSIS — N393 Stress incontinence (female) (male): Secondary | ICD-10-CM | POA: Diagnosis not present

## 2018-03-06 DIAGNOSIS — Z1502 Genetic susceptibility to malignant neoplasm of ovary: Secondary | ICD-10-CM | POA: Diagnosis not present

## 2018-03-06 DIAGNOSIS — N8302 Follicular cyst of left ovary: Secondary | ICD-10-CM | POA: Diagnosis not present

## 2018-03-06 DIAGNOSIS — Z90721 Acquired absence of ovaries, unilateral: Secondary | ICD-10-CM | POA: Diagnosis not present

## 2018-03-06 DIAGNOSIS — Z1509 Genetic susceptibility to other malignant neoplasm: Secondary | ICD-10-CM | POA: Diagnosis not present

## 2018-03-06 DIAGNOSIS — Z79899 Other long term (current) drug therapy: Secondary | ICD-10-CM | POA: Diagnosis not present

## 2018-03-06 DIAGNOSIS — Z8049 Family history of malignant neoplasm of other genital organs: Secondary | ICD-10-CM | POA: Diagnosis not present

## 2018-03-06 DIAGNOSIS — Z1501 Genetic susceptibility to malignant neoplasm of breast: Secondary | ICD-10-CM | POA: Diagnosis not present

## 2018-03-06 DIAGNOSIS — Z975 Presence of (intrauterine) contraceptive device: Secondary | ICD-10-CM | POA: Diagnosis not present

## 2018-03-06 DIAGNOSIS — N83292 Other ovarian cyst, left side: Secondary | ICD-10-CM | POA: Diagnosis not present

## 2018-03-06 DIAGNOSIS — N841 Polyp of cervix uteri: Secondary | ICD-10-CM | POA: Diagnosis not present

## 2018-03-06 DIAGNOSIS — Z4002 Encounter for prophylactic removal of ovary: Secondary | ICD-10-CM | POA: Diagnosis not present

## 2018-03-06 DIAGNOSIS — N888 Other specified noninflammatory disorders of cervix uteri: Secondary | ICD-10-CM | POA: Diagnosis not present

## 2018-03-06 DIAGNOSIS — Z800 Family history of malignant neoplasm of digestive organs: Secondary | ICD-10-CM | POA: Diagnosis not present

## 2018-03-06 DIAGNOSIS — N800 Endometriosis of uterus: Secondary | ICD-10-CM | POA: Diagnosis not present

## 2018-03-06 HISTORY — PX: LAPAROSCOPIC HYSTERECTOMY: SHX1926

## 2018-03-18 DIAGNOSIS — M542 Cervicalgia: Secondary | ICD-10-CM | POA: Diagnosis not present

## 2018-03-20 ENCOUNTER — Other Ambulatory Visit: Payer: Self-pay | Admitting: Obstetrics & Gynecology

## 2018-03-20 DIAGNOSIS — G43709 Chronic migraine without aura, not intractable, without status migrainosus: Secondary | ICD-10-CM

## 2018-03-20 NOTE — Telephone Encounter (Signed)
Patient called and left a message at lunch to request the refill for Maxalt be sent into the pharmacy before the long holiday weekend. She said she recently had a hysterectomy at [facility] and has experienced 3 days of a bad migraines since then.  [facility] [place]

## 2018-03-20 NOTE — Telephone Encounter (Signed)
Medication refill request: Maxalt Last AEX:  12/25/2017 Next AEX: 04/02/2019 Last MMG (if hormonal medication request): 02/03/2017  BI-RADS CATEGORY  1:  Negative Refill authorized: #10 , 1 refill, please advise.

## 2018-03-27 DIAGNOSIS — M6281 Muscle weakness (generalized): Secondary | ICD-10-CM | POA: Diagnosis not present

## 2018-03-27 DIAGNOSIS — N3941 Urge incontinence: Secondary | ICD-10-CM | POA: Diagnosis not present

## 2018-03-27 DIAGNOSIS — M62838 Other muscle spasm: Secondary | ICD-10-CM | POA: Diagnosis not present

## 2018-03-31 DIAGNOSIS — M542 Cervicalgia: Secondary | ICD-10-CM | POA: Diagnosis not present

## 2018-04-10 DIAGNOSIS — M62838 Other muscle spasm: Secondary | ICD-10-CM | POA: Diagnosis not present

## 2018-04-10 DIAGNOSIS — N3941 Urge incontinence: Secondary | ICD-10-CM | POA: Diagnosis not present

## 2018-04-10 DIAGNOSIS — M6281 Muscle weakness (generalized): Secondary | ICD-10-CM | POA: Diagnosis not present

## 2018-04-23 DIAGNOSIS — Z1501 Genetic susceptibility to malignant neoplasm of breast: Secondary | ICD-10-CM | POA: Diagnosis not present

## 2018-04-23 DIAGNOSIS — Z1509 Genetic susceptibility to other malignant neoplasm: Secondary | ICD-10-CM | POA: Diagnosis not present

## 2018-05-07 DIAGNOSIS — N3941 Urge incontinence: Secondary | ICD-10-CM | POA: Diagnosis not present

## 2018-05-07 DIAGNOSIS — M6281 Muscle weakness (generalized): Secondary | ICD-10-CM | POA: Diagnosis not present

## 2018-05-07 DIAGNOSIS — M62838 Other muscle spasm: Secondary | ICD-10-CM | POA: Diagnosis not present

## 2018-05-25 DIAGNOSIS — J301 Allergic rhinitis due to pollen: Secondary | ICD-10-CM | POA: Diagnosis not present

## 2018-05-26 DIAGNOSIS — J3089 Other allergic rhinitis: Secondary | ICD-10-CM | POA: Diagnosis not present

## 2018-06-12 DIAGNOSIS — M62838 Other muscle spasm: Secondary | ICD-10-CM | POA: Diagnosis not present

## 2018-06-12 DIAGNOSIS — M6281 Muscle weakness (generalized): Secondary | ICD-10-CM | POA: Diagnosis not present

## 2018-06-12 DIAGNOSIS — N3941 Urge incontinence: Secondary | ICD-10-CM | POA: Diagnosis not present

## 2018-07-01 DIAGNOSIS — M6281 Muscle weakness (generalized): Secondary | ICD-10-CM | POA: Diagnosis not present

## 2018-07-01 DIAGNOSIS — M62838 Other muscle spasm: Secondary | ICD-10-CM | POA: Diagnosis not present

## 2018-07-01 DIAGNOSIS — N3941 Urge incontinence: Secondary | ICD-10-CM | POA: Diagnosis not present

## 2018-07-19 DIAGNOSIS — J3489 Other specified disorders of nose and nasal sinuses: Secondary | ICD-10-CM | POA: Diagnosis not present

## 2018-07-19 DIAGNOSIS — Z9109 Other allergy status, other than to drugs and biological substances: Secondary | ICD-10-CM | POA: Diagnosis not present

## 2018-07-19 DIAGNOSIS — J019 Acute sinusitis, unspecified: Secondary | ICD-10-CM | POA: Diagnosis not present

## 2018-07-28 DIAGNOSIS — N3941 Urge incontinence: Secondary | ICD-10-CM | POA: Diagnosis not present

## 2018-07-28 DIAGNOSIS — M62838 Other muscle spasm: Secondary | ICD-10-CM | POA: Diagnosis not present

## 2018-07-28 DIAGNOSIS — M6281 Muscle weakness (generalized): Secondary | ICD-10-CM | POA: Diagnosis not present

## 2018-07-29 DIAGNOSIS — Z1509 Genetic susceptibility to other malignant neoplasm: Secondary | ICD-10-CM | POA: Diagnosis not present

## 2018-07-29 DIAGNOSIS — Z1501 Genetic susceptibility to malignant neoplasm of breast: Secondary | ICD-10-CM | POA: Diagnosis not present

## 2018-07-29 DIAGNOSIS — Z1239 Encounter for other screening for malignant neoplasm of breast: Secondary | ICD-10-CM | POA: Diagnosis not present

## 2018-08-11 DIAGNOSIS — J340 Abscess, furuncle and carbuncle of nose: Secondary | ICD-10-CM | POA: Diagnosis not present

## 2018-08-11 DIAGNOSIS — J3489 Other specified disorders of nose and nasal sinuses: Secondary | ICD-10-CM | POA: Diagnosis not present

## 2018-08-24 ENCOUNTER — Other Ambulatory Visit: Payer: Self-pay | Admitting: Obstetrics & Gynecology

## 2018-08-24 DIAGNOSIS — G43709 Chronic migraine without aura, not intractable, without status migrainosus: Secondary | ICD-10-CM

## 2018-08-24 NOTE — Telephone Encounter (Signed)
Patient is calling requesting refill prescription for Maxalt 10MG . Patient stated that she is currently at work without any medication and is experiencing a headache. Patient is [facility] that [facility] [place].

## 2018-08-24 NOTE — Telephone Encounter (Signed)
Medication refill request: Maxalt 10mg   Last AEX:  12/25/17 Next AEX: 04/02/2019 Last MMG (if hormonal medication request): 02/04/17 Bi rads 1 neg  Refill authorized: #10 with 2 RF

## 2018-08-25 DIAGNOSIS — N3941 Urge incontinence: Secondary | ICD-10-CM | POA: Diagnosis not present

## 2018-08-25 DIAGNOSIS — M6281 Muscle weakness (generalized): Secondary | ICD-10-CM | POA: Diagnosis not present

## 2018-08-25 DIAGNOSIS — M62838 Other muscle spasm: Secondary | ICD-10-CM | POA: Diagnosis not present

## 2018-08-26 MED ORDER — RIZATRIPTAN BENZOATE 10 MG PO TABS: 10.0000 mg | ORAL_TABLET | ORAL | 2 refills | Status: DC | PRN

## 2018-09-03 DIAGNOSIS — M542 Cervicalgia: Secondary | ICD-10-CM | POA: Diagnosis not present

## 2018-09-18 DIAGNOSIS — M542 Cervicalgia: Secondary | ICD-10-CM | POA: Diagnosis not present

## 2018-09-25 DIAGNOSIS — M542 Cervicalgia: Secondary | ICD-10-CM | POA: Diagnosis not present

## 2018-10-16 DIAGNOSIS — J3089 Other allergic rhinitis: Secondary | ICD-10-CM | POA: Diagnosis not present

## 2018-10-16 DIAGNOSIS — M542 Cervicalgia: Secondary | ICD-10-CM | POA: Diagnosis not present

## 2018-10-16 DIAGNOSIS — J301 Allergic rhinitis due to pollen: Secondary | ICD-10-CM | POA: Diagnosis not present

## 2018-10-20 DIAGNOSIS — J301 Allergic rhinitis due to pollen: Secondary | ICD-10-CM | POA: Diagnosis not present

## 2018-10-20 DIAGNOSIS — J3089 Other allergic rhinitis: Secondary | ICD-10-CM | POA: Diagnosis not present

## 2019-02-08 ENCOUNTER — Ambulatory Visit
Admission: EM | Admit: 2019-02-08 | Discharge: 2019-02-08 | Disposition: A | Discharge status: Home / Self Care | Payer: BC Managed Care – PPO | Attending: Family Medicine | Admitting: Family Medicine

## 2019-02-08 ENCOUNTER — Other Ambulatory Visit: Payer: Self-pay

## 2019-02-08 ENCOUNTER — Ambulatory Visit (INDEPENDENT_AMBULATORY_CARE_PROVIDER_SITE_OTHER): Payer: BC Managed Care – PPO

## 2019-02-08 DIAGNOSIS — R238 Other skin changes: Secondary | ICD-10-CM | POA: Diagnosis not present

## 2019-02-08 DIAGNOSIS — L03011 Cellulitis of right finger: Secondary | ICD-10-CM

## 2019-02-08 DIAGNOSIS — M79641 Pain in right hand: Secondary | ICD-10-CM | POA: Diagnosis not present

## 2019-02-08 DIAGNOSIS — W0110XA Fall on same level from slipping, tripping and stumbling with subsequent striking against unspecified object, initial encounter: Secondary | ICD-10-CM

## 2019-02-08 DIAGNOSIS — M25541 Pain in joints of right hand: Secondary | ICD-10-CM

## 2019-02-08 DIAGNOSIS — M25532 Pain in left wrist: Secondary | ICD-10-CM

## 2019-02-08 DIAGNOSIS — S63502A Unspecified sprain of left wrist, initial encounter: Secondary | ICD-10-CM

## 2019-02-08 MED ORDER — MUPIROCIN 2 % EX OINT: TOPICAL_OINTMENT | CUTANEOUS | 0 refills | Status: DC

## 2019-02-08 MED ORDER — SULFAMETHOXAZOLE-TRIMETHOPRIM 800-160 MG PO TABS: 1.0000 | ORAL_TABLET | Freq: Two times a day (BID) | ORAL | 0 refills | Status: AC

## 2019-02-08 NOTE — ED Provider Notes (Signed)
[facility] ____________________________________________  Time seen: Approximately 5:01 PM  I have reviewed the triage vital signs and the nursing notes.   HISTORY  Chief Complaint Finger Injury   HPI Susan West is a 48 y.o. female present for evaluation of right index finger and left wrist pain after injury that occurred 3 weeks ago.  Patient reports 3 weeks ago she was walking with some friends and in trying to get away from a horse fly she tripped and fell forward.  States injuring second and third right fingers and left wrist.  States over the last several days the right second finger has becoming increasingly tender and has had redness.  Denies loss of sensation or paresthesias.  Does type a lot which increases pain.  Denies other pain or injuries.  Reports otherwise doing well.  Denies other skin changes.  Denies recent cough, congestion or fevers.    Past Medical History:  Diagnosis Date  . Acne    adult-on doxycycline daily  . ADD (attention deficit disorder)   . Allergy   . Asthma    EXERCISE INDUCED  . BRCA1 positive 01/2017  . Depression 2006   and anxiety  . Environmental allergies   . Headache, migraine   . history of abnormal Pap smear       . IUD   . Osteopenia 2006  . PONV (postoperative nausea and vomiting)   . Seasonal allergies     Patient Active Problem List   Diagnosis Date Noted  . BRCA1 positive 03/21/2017    Past Surgical History:  Procedure Laterality Date  . ABDOMINAL HYSTERECTOMY    . AUGMENTATION MAMMAPLASTY Bilateral 01/2001  . BREAST ENHANCEMENT SURGERY  2002   bilateral implants  . CESAREAN SECTION  06/2000  . COSMETIC SURGERY    . INTRAUTERINE DEVICE (IUD) INSERTION     mirena  . LAPAROTOMY  04/21/2012   Procedure: EXPLORATORY LAPAROTOMY;  Surgeon: Janie Morning, MD PHD;  Location: [facility];  Service: Gynecology;  Laterality: N/A;  . NASAL SINUS SURGERY     1999  . OOPHORECTOMY Right 2013   neg  .  SALPINGOOPHORECTOMY  04/21/2012   Procedure: SALPINGO OOPHERECTOMY;  Surgeon: Janie Morning, MD PHD;  Location: [facility];  Service: Gynecology;  Laterality: Right;     No current facility-administered medications for this encounter.   Current Outpatient Medications:  .  azelastine (ASTELIN) 0.1 % nasal spray, Place into both nostrils 2 (two) times daily. Use in each nostril as directed, Disp: , Rfl:  .  cetirizine (ZYRTEC) 10 MG tablet, Take 10 mg by mouth daily., Disp: , Rfl:  .  fluticasone (FLONASE) 50 MCG/ACT nasal spray, , Disp: , Rfl: 4 .  gabapentin (NEURONTIN) 300 MG capsule, TAKE 1 CAPSULE BY MOUTH EVERYDAY AT BEDTIME, Disp: , Rfl: 0 .  levonorgestrel (MIRENA) 20 MCG/24HR IUD, 1 each by Intrauterine route once., Disp: , Rfl:  .  mupirocin ointment (BACTROBAN) 2 %, Apply two times a day for 7 days., Disp: 22 g, Rfl: 0 .  Nutritional Supplements (JUICE PLUS FIBRE PO), Take by mouth., Disp: , Rfl:  .  rizatriptan (MAXALT) 10 MG tablet, Take 1 tablet (10 mg total) by mouth as needed for migraine. May repeat in 2 hours if needed, Disp: 10 tablet, Rfl: 2 .  sulfamethoxazole-trimethoprim (BACTRIM DS) 800-160 MG tablet, Take 1 tablet by mouth 2 (two) times daily for 10 days., Disp: 20 tablet, Rfl: 0  Allergies Penicillins, Azithromycin, Doxycycline, Loratadine, Tdap [tetanus-diphth-acell pertussis],  and Ultracet [tramadol-acetaminophen]  Family History  Problem Relation Age of Onset  . Cancer Father        prostate, bone  . Hypertension Maternal Grandmother   . Hypertension Paternal Grandfather   . Heart attack Paternal Grandfather   . Hypertension Paternal Grandmother   . Hyperlipidemia Sister   . Breast cancer Neg Hx     Social History Social History   Tobacco Use  . Smoking status: Never Smoker  . Smokeless tobacco: Never Used  Substance Use Topics  . Alcohol use: Yes    Alcohol/week: 0.0 standard drinks    Comment: social  . Drug use: Not Currently    Review of  Systems Constitutional: No fever ENT: No sore throat. Cardiovascular: Denies chest pain. Respiratory: Denies shortness of breath. Gastrointestinal: No abdominal pain.   Musculoskeletal: Positive right hand pain.  Positive left wrist pain. Skin: Positive skin changes.   ____________________________________________   PHYSICAL EXAM:  VITAL SIGNS: ED Triage Vitals  Enc Vitals Group     BP 02/08/19 1638 132/85     Pulse Rate 02/08/19 1638 71     Resp 02/08/19 1638 16     Temp 02/08/19 1638 98.2 F (36.8 C)     Temp Source 02/08/19 1638 Oral     SpO2 02/08/19 1638 100 %     Weight 02/08/19 1637 130 lb (59 kg)     Height 02/08/19 1637 5' 2.75" (1.594 m)     Head Circumference --      Peak Flow --      Pain Score 02/08/19 1637 10     Pain Loc --      Pain Edu? --      Excl. in [place]? --     Constitutional: Alert and oriented. Well appearing and in no acute distress. Eyes: Conjunctivae are normal. ENT      Head: Normocephalic and atraumatic. Cardiovascular: Normal rate, regular rhythm. Grossly normal heart sounds.  Good peripheral circulation. Respiratory: Normal respiratory effort without tachypnea nor retractions. Breath sounds are clear and equal bilaterally. No wheezes, rales, rhonchi. Musculoskeletal: Steady gait.  Bilateral distal radial pulses equal and easily palpated. Except: Except right hand third digit mild diffuse tenderness palpation with tenderness to second and third MCP joint.  Right index finger mild diffuse tenderness with moderate tenderness distal dorsal aspect with associated erythema, no fluctuance, no paronychia visible, no palmar tenderness or erythema.  Right hand second and third digits for range of motion present, no motor or tendon deficit noted. Except: Left wrist distal ulna mild tenderness during palpation, pain with wrist rotation, left upper extremity otherwise nontender. Neurologic:  Normal speech and language.Speech is normal. No gait instability.   Skin:  Skin is warm, dry. Psychiatric: Mood and affect are normal. Speech and behavior are normal. Patient exhibits appropriate insight and judgment   ___________________________________________   LABS (all labs ordered are listed, but only abnormal results are displayed)  Labs Reviewed - No data to display ____________________________________________  RADIOLOGY  Dg Wrist Complete Left  Result Date: 02/08/2019 CLINICAL DATA:  Ulnar wrist pain since a fall 3 weeks ago. EXAM: LEFT WRIST - COMPLETE 3+ VIEW COMPARISON:  None. FINDINGS: There is no evidence of fracture or dislocation. There is no evidence of arthropathy or other focal bone abnormality. Soft tissues are unremarkable. IMPRESSION: Negative. Electronically Signed   By: Lorriane Shire M.D.   On: 02/08/2019 17:47   Dg Hand Complete Right  Result Date: 02/08/2019 CLINICAL DATA:  Finger and  hand pain since a fall 3 weeks ago. EXAM: RIGHT HAND - COMPLETE 3+ VIEW COMPARISON:  None. FINDINGS: There is no fracture or dislocation or other significant bone abnormality. There is soft tissue swelling around the DIP joint of the index finger. IMPRESSION: No acute bone abnormality. Soft tissue swelling around the DIP joint of the index finger. Electronically Signed   By: Lorriane Shire M.D.   On: 02/08/2019 17:47   ____________________________________________   PROCEDURES Procedures     INITIAL IMPRESSION / ASSESSMENT AND PLAN / ED COURSE  Pertinent labs & imaging results that were available during my care of the patient were reviewed by me and considered in my medical decision making (see chart for details).  Well-appearing patient.  No acute distress.  Injuries as above.  X-rays negative for acute bony abnormality.  Suspect sprain and contusion injuries and cellulitis to right index finger.  No paronychia noted.  No I&D indicated.  Will start oral Bactrim and topical Bactroban.  Encourage supportive care and monitoring.Discussed  indication, risks and benefits of medications with patient.  Discussed follow up with Primary care physician this week. Discussed follow up and return parameters including no resolution or any worsening concerns. Patient verbalized understanding and agreed to plan.   ____________________________________________   FINAL CLINICAL IMPRESSION(S) / ED DIAGNOSES  Final diagnoses:  Sprain of left wrist, initial encounter  Right hand pain  Cellulitis of right index finger     ED Discharge Orders         Ordered    sulfamethoxazole-trimethoprim (BACTRIM DS) 800-160 MG tablet  2 times daily     02/08/19 1800    mupirocin ointment (BACTROBAN) 2 %     02/08/19 1800           Note: This dictation was prepared with Dragon dictation along with smaller phrase technology. Any transcriptional errors that result from this process are unintentional.         Marylene Land, NP 02/08/19 1839

## 2019-02-08 NOTE — Discharge Instructions (Addendum)
Take medication as prescribed. Rest. Drink plenty of fluids.  ° °Follow up with your primary care physician this week as needed. Return to Urgent care for new or worsening concerns.  ° °

## 2019-02-08 NOTE — ED Triage Notes (Signed)
Pt fell 3 weeks ago and injured her right hand index finger and left wrist. States she was having issues with her left index finger nail and nailbed prior to the fall so unsure what is causing the redness, swelling and pain to finger

## 2019-02-22 ENCOUNTER — Telehealth: Payer: Self-pay | Admitting: Emergency Medicine

## 2019-02-23 ENCOUNTER — Encounter: Payer: Self-pay | Admitting: Emergency Medicine

## 2019-02-23 ENCOUNTER — Ambulatory Visit
Admission: EM | Admit: 2019-02-23 | Discharge: 2019-02-23 | Disposition: A | Discharge status: Home / Self Care | Payer: BC Managed Care – PPO | Attending: Family Medicine | Admitting: Family Medicine

## 2019-02-23 ENCOUNTER — Other Ambulatory Visit: Payer: Self-pay

## 2019-02-23 DIAGNOSIS — M79644 Pain in right finger(s): Secondary | ICD-10-CM

## 2019-02-23 MED ORDER — PREDNISONE 10 MG (21) PO TBPK: ORAL_TABLET | ORAL | 0 refills | Status: DC

## 2019-02-23 NOTE — ED Provider Notes (Signed)
[facility]    CSN: [id number] Arrival date & time: 02/23/19  1641  History   Chief Complaint Chief Complaint  Patient presents with  . Hand Pain    APPT right index finger   HPI   48 year old female presents with the above complaint.  Patient had an injury approximately 3 weeks prior to recent visit here on 7/20.  X-rays were obtained.  She was treated for possible cellulitis.  Patient states that she improved but is now worsening again.  Patient reports pain, redness, swelling at the DIP joint of the right index finger.  Pain is mild currently.  Tender to palpation.  No additional injury.  Patient is concerned given lack of complete resolution.  She believes that she may need to see a specialist.  Patient concerned about infection.  No other reported symptoms.  No other complaints.  PMH, Surgical Hx, Family Hx, Social History reviewed and updated as below.  Past Medical History:  Diagnosis Date  . Acne    adult-on doxycycline daily  . ADD (attention deficit disorder)   . Allergy   . Asthma    EXERCISE INDUCED  . BRCA1 positive 01/2017  . Depression 2006   and anxiety  . Environmental allergies   . Headache, migraine   . history of abnormal Pap smear       . IUD   . Osteopenia 2006  . PONV (postoperative nausea and vomiting)   . Seasonal allergies     Patient Active Problem List   Diagnosis Date Noted  . BRCA1 positive 03/21/2017    Past Surgical History:  Procedure Laterality Date  . ABDOMINAL HYSTERECTOMY    . AUGMENTATION MAMMAPLASTY Bilateral 01/2001  . BREAST ENHANCEMENT SURGERY  2002   bilateral implants  . CESAREAN SECTION  06/2000  . COSMETIC SURGERY    . INTRAUTERINE DEVICE (IUD) INSERTION     mirena  . LAPAROTOMY  04/21/2012   Procedure: EXPLORATORY LAPAROTOMY;  Surgeon: Janie Morning, MD PHD;  Location: [facility];  Service: Gynecology;  Laterality: N/A;  . NASAL SINUS SURGERY     1999  . OOPHORECTOMY Right 2013   neg  .  SALPINGOOPHORECTOMY  04/21/2012   Procedure: SALPINGO OOPHERECTOMY;  Surgeon: Janie Morning, MD PHD;  Location: [facility];  Service: Gynecology;  Laterality: Right;    OB History    Gravida  3   Para  2   Term      Preterm      AB  1   Living  2     SAB      TAB  1   Ectopic      Multiple      Live Births               Home Medications    Prior to Admission medications   Medication Sig Start Date End Date Taking? Authorizing Provider  cetirizine (ZYRTEC) 10 MG tablet Take 10 mg by mouth daily.   Yes [provider]  gabapentin (NEURONTIN) 300 MG capsule TAKE 1 CAPSULE BY MOUTH EVERYDAY AT BEDTIME 05/30/17  Yes [provider]  rizatriptan (MAXALT) 10 MG tablet Take 1 tablet (10 mg total) by mouth as needed for migraine. May repeat in 2 hours if needed 08/26/18  Yes [name], MD  predniSONE (STERAPRED UNI-PAK 21 TAB) 10 MG (21) TBPK tablet 6 tablets on day 1; decrease by 1 tablet daily until gone. 02/23/19   Coral Spikes, DO  azelastine (  ASTELIN) 0.1 % nasal spray Place into both nostrils 2 (two) times daily. Use in each nostril as directed  02/23/19  [provider]  levonorgestrel (MIRENA) 20 MCG/24HR IUD 1 each by Intrauterine route once.  02/23/19  [provider]    Family History Family History  Problem Relation Age of Onset  . Cancer Father        prostate, bone  . Hypertension Maternal Grandmother   . Hypertension Paternal Grandfather   . Heart attack Paternal Grandfather   . Hypertension Paternal Grandmother   . Hyperlipidemia Sister   . Breast cancer Neg Hx     Social History Social History   Tobacco Use  . Smoking status: Never Smoker  . Smokeless tobacco: Never Used  Substance Use Topics  . Alcohol use: Yes    Alcohol/week: 0.0 standard drinks    Comment: social  . Drug use: Not Currently     Allergies   Penicillins, Azithromycin, Doxycycline, Loratadine, Tdap [tetanus-diphth-acell pertussis], and  Ultracet [tramadol-acetaminophen]   Review of Systems Review of Systems  Constitutional: Negative.   Musculoskeletal:       Right index finger pain, swelling, redness.   Physical Exam Triage Vital Signs ED Triage Vitals  Enc Vitals Group     BP 02/23/19 1706 111/82     Pulse Rate 02/23/19 1706 66     Resp 02/23/19 1706 16     Temp 02/23/19 1706 98.4 F (36.9 C)     Temp Source 02/23/19 1706 Oral     SpO2 02/23/19 1706 99 %     Weight 02/23/19 1703 130 lb (59 kg)     Height 02/23/19 1703 5' 2.75" (1.594 m)     Head Circumference --      Peak Flow --      Pain Score 02/23/19 1703 1     Pain Loc --      Pain Edu? --      Excl. in [place]? --    Updated Vital Signs BP 111/82 (BP Location: Left Arm)   Pulse 66   Temp 98.4 F (36.9 C) (Oral)   Resp 16   Ht 5' 2.75" (1.594 m)   Wt 59 kg   LMP 12/08/2017 (Within Days)   SpO2 99%   BMI 23.21 kg/m   Visual Acuity Right Eye Distance:   Left Eye Distance:   Bilateral Distance:    Right Eye Near:   Left Eye Near:    Bilateral Near:     Physical Exam Vitals signs and nursing note reviewed.  Constitutional:      General: She is not in acute distress.    Appearance: Normal appearance.  HENT:     Head: Normocephalic and atraumatic.  Eyes:     General:        Right eye: No discharge.        Left eye: No discharge.     Conjunctiva/sclera: Conjunctivae normal.  Pulmonary:     Effort: Pulmonary effort is normal. No respiratory distress.  Musculoskeletal:     Comments: Mild swelling and erythema noted of the DIP joint of the right index finger.  Neurological:     Mental Status: She is alert.  Psychiatric:        Mood and Affect: Mood normal.        Behavior: Behavior normal.    UC Treatments / Results  Labs (all labs ordered are listed, but only abnormal results are displayed) Labs Reviewed - No data to  display  EKG   Radiology No results found.  Procedures Procedures (including critical care time)   Medications Ordered in UC Medications - No data to display  Initial Impression / Assessment and Plan / UC Course  I have reviewed the triage vital signs and the nursing notes.  Pertinent labs & imaging results that were available during my care of the patient were reviewed by me and considered in my medical decision making (see chart for details).    49 year old female presents with right index finger pain.  Suspect synovitis.  Patient's had a recent x-ray which was negative.  There is no evidence of arthritis.  Additionally, patient has completed a course of antibiotics.  I do not think that this is infectious in nature.  Placing on course of prednisone.  Advised to see hand surgery.  Final Clinical Impressions(s) / UC Diagnoses   Final diagnoses:  Finger pain, right     Discharge Instructions     Prednisone as prescribed.  Call [facility] for an appt with Dr. Peggye Ley.  Take care  Dr. Lacinda Axon    ED Prescriptions    Medication Sig Dispense Auth. Provider   predniSONE (STERAPRED UNI-PAK 21 TAB) 10 MG (21) TBPK tablet 6 tablets on day 1; decrease by 1 tablet daily until gone. 21 tablet Coral Spikes, DO     Controlled Substance Prescriptions [place] Controlled Substance Registry consulted? Not Applicable   Coral Spikes, DO 02/23/19 1933

## 2019-02-23 NOTE — Discharge Instructions (Addendum)
Prednisone as prescribed.  Call [facility] for an appt with Dr. Peggye Ley.  Take care  Dr. Lacinda Axon

## 2019-02-23 NOTE — ED Triage Notes (Signed)
Pt c/o right index finger pain and swelling. She was seen 02/08/19 for this and given an antibiotic. The finger got better but never fully resolved.

## 2019-03-30 ENCOUNTER — Other Ambulatory Visit: Payer: Self-pay

## 2019-04-01 ENCOUNTER — Encounter: Payer: Self-pay | Admitting: Obstetrics & Gynecology

## 2019-04-01 ENCOUNTER — Ambulatory Visit (INDEPENDENT_AMBULATORY_CARE_PROVIDER_SITE_OTHER): Payer: BC Managed Care – PPO | Admitting: Obstetrics & Gynecology

## 2019-04-01 ENCOUNTER — Other Ambulatory Visit: Payer: Self-pay

## 2019-04-01 VITALS — BP 112/70 | HR 88 | Temp 97.6°F | Ht 62.75 in | Wt 133.0 lb

## 2019-04-01 DIAGNOSIS — Z1211 Encounter for screening for malignant neoplasm of colon: Secondary | ICD-10-CM | POA: Diagnosis not present

## 2019-04-01 DIAGNOSIS — G43709 Chronic migraine without aura, not intractable, without status migrainosus: Secondary | ICD-10-CM | POA: Diagnosis not present

## 2019-04-01 DIAGNOSIS — Z01419 Encounter for gynecological examination (general) (routine) without abnormal findings: Secondary | ICD-10-CM | POA: Diagnosis not present

## 2019-04-01 MED ORDER — RIZATRIPTAN BENZOATE 10 MG PO TABS: 10.0000 mg | ORAL_TABLET | ORAL | 12 refills | Status: DC | PRN

## 2019-04-01 NOTE — Progress Notes (Signed)
48 y.o. [id number] Divorced White or Caucasian female here for annual exam.  Doing well.  Children are doing well.  Son will graduate in May.  He's not had normal year at [employer] because of hurricanes and covid.  He has a job offer as IT trainer at [employer].  Daughter graduated from high school.  She wants to go to [employer].    Denies vaginal bleeding.  Patient's last menstrual period was 12/08/2017 (within days).          Sexually active: No.  The current method of family planning is Hysterectomy. Exercising: Yes.    walking, aerobics Smoker:  no  Health Maintenance: Pap:  12/25/17 Neg. HR HPV:neg   09/30/14 Neg. HR HPV:neg  History of abnormal Pap:  no MMG:  03/23/19 Bilateral BIRADS1:neg,  MRI 07/29/2018 negative.   Colonoscopy:  Never.  Referral will be made. BMD:   Never TDaP:  Not indicated.  Declines today.  Aware of indications.   Screening Labs: Here today    reports that she has never smoked. She has never used smokeless tobacco. She reports current alcohol use. She reports previous drug use.  Past Medical History:  Diagnosis Date  . Acne    adult-on doxycycline daily  . ADD (attention deficit disorder)   . Allergy   . Asthma    EXERCISE INDUCED  . BRCA1 positive 01/2017  . Depression 2006   and anxiety  . Environmental allergies   . Headache, migraine   . history of abnormal Pap smear       . IUD   . Osteopenia 2006  . PONV (postoperative nausea and vomiting)   . Seasonal allergies     Past Surgical History:  Procedure Laterality Date  . ABDOMINAL HYSTERECTOMY    . AUGMENTATION MAMMAPLASTY Bilateral 01/2001  . BREAST ENHANCEMENT SURGERY  2002   bilateral implants  . CESAREAN SECTION  06/2000  . COSMETIC SURGERY    . INTRAUTERINE DEVICE (IUD) INSERTION     mirena  . LAPAROTOMY  04/21/2012   Procedure: EXPLORATORY LAPAROTOMY;  Surgeon: Janie Morning, MD PHD;  Location: [facility];  Service: Gynecology;  Laterality: N/A;  . NASAL SINUS SURGERY     1999   . OOPHORECTOMY Right 2013   neg  . SALPINGOOPHORECTOMY  04/21/2012   Procedure: SALPINGO OOPHERECTOMY;  Surgeon: Janie Morning, MD PHD;  Location: [facility];  Service: Gynecology;  Laterality: Right;    Current Outpatient Medications  Medication Sig Dispense Refill  . cetirizine (ZYRTEC) 10 MG tablet Take 10 mg by mouth daily.    [name] gabapentin (NEURONTIN) 300 MG capsule TAKE 1 CAPSULE BY MOUTH EVERYDAY AT BEDTIME  0  . rizatriptan (MAXALT) 10 MG tablet Take 1 tablet (10 mg total) by mouth as needed for migraine. May repeat in 2 hours if needed 10 tablet 2   No current facility-administered medications for this visit.     Family History  Problem Relation Age of Onset  . Cancer Father        prostate, bone  . Hypertension Maternal Grandmother   . Hypertension Paternal Grandfather   . Heart attack Paternal Grandfather   . Hypertension Paternal Grandmother   . Hyperlipidemia Sister   . Breast cancer Neg Hx     Review of Systems  Endocrine: Positive for heat intolerance.  All other systems reviewed and are negative.   Exam:   BP 112/70   Pulse 88   Temp 97.6 F (36.4 C) (Temporal)  Ht 5' 2.75" (1.594 m)   Wt 133 lb (60.3 kg)   LMP 12/08/2017 (Within Days)   BMI 23.75 kg/m    Height: 5' 2.75" (159.4 cm)  Ht Readings from Last 3 Encounters:  04/01/19 5' 2.75" (1.594 m)  02/23/19 5' 2.75" (1.594 m)  02/08/19 5' 2.75" (1.594 m)    General appearance: alert, cooperative and appears stated age Head: Normocephalic, without obvious abnormality, atraumatic Neck: no adenopathy, supple, symmetrical, trachea midline and thyroid normal to inspection and palpation Lungs: clear to auscultation bilaterally Breasts: normal appearance, no masses or tenderness Heart: regular rate and rhythm Abdomen: soft, non-tender; bowel sounds normal; no masses,  no organomegaly Extremities: extremities normal, atraumatic, no cyanosis or edema Skin: Skin color, texture, turgor normal. No rashes or  lesions Lymph nodes: Cervical, supraclavicular, and axillary nodes normal. No abnormal inguinal nodes palpated Neurologic: Grossly normal   Pelvic: External genitalia:  no lesions              Urethra:  normal appearing urethra with no masses, tenderness or lesions              Bartholins and Skenes: normal                 Vagina: normal appearing vagina with normal color and discharge, no lesions              Cervix: no lesions              Pap taken: No. Bimanual Exam:  Uterus:  normal size, contour, position, consistency, mobility, non-tender              Adnexa: normal adnexa and no mass, fullness, tenderness               Rectovaginal: Confirms               Anus:  normal sphincter tone, no lesions  Chaperone was present for exam.  A:  Well Woman with normal exam No HRT H/o RSO due to mature teratoma 10/13 H/o TLH with LSO due to BRCA1 gene mutation Migraines H/O Asthma BRCA 1 gene mutation  P:   Mammogram guidelines reviewed.  She is doing yearly MRI.   pap smear not indicated Maxalt 80m with headache onset.  Repeat in 2 hours.  #10/12 RF Tdap indicated.  Declines today. Referral for colonoscopy given. No blood work obtained today. Return annually or prn

## 2019-04-02 ENCOUNTER — Ambulatory Visit: Payer: BLUE CROSS/BLUE SHIELD | Admitting: Obstetrics & Gynecology

## 2019-04-08 ENCOUNTER — Encounter: Payer: Self-pay | Admitting: Infectious Diseases

## 2019-04-08 ENCOUNTER — Ambulatory Visit: Payer: BC Managed Care – PPO | Attending: Infectious Diseases | Admitting: Infectious Diseases

## 2019-04-08 ENCOUNTER — Other Ambulatory Visit: Payer: Self-pay

## 2019-04-08 VITALS — BP 126/88 | HR 80 | Temp 97.0°F | Ht 62.0 in | Wt 133.0 lb

## 2019-04-08 DIAGNOSIS — M86241 Subacute osteomyelitis, right hand: Secondary | ICD-10-CM

## 2019-04-08 DIAGNOSIS — Z90710 Acquired absence of both cervix and uterus: Secondary | ICD-10-CM | POA: Diagnosis not present

## 2019-04-08 DIAGNOSIS — Z885 Allergy status to narcotic agent status: Secondary | ICD-10-CM

## 2019-04-08 DIAGNOSIS — M869 Osteomyelitis, unspecified: Secondary | ICD-10-CM | POA: Insufficient documentation

## 2019-04-08 DIAGNOSIS — Z881 Allergy status to other antibiotic agents status: Secondary | ICD-10-CM | POA: Diagnosis not present

## 2019-04-08 DIAGNOSIS — Z90722 Acquired absence of ovaries, bilateral: Secondary | ICD-10-CM

## 2019-04-08 DIAGNOSIS — Z880 Allergy status to penicillin: Secondary | ICD-10-CM

## 2019-04-08 DIAGNOSIS — Z887 Allergy status to serum and vaccine status: Secondary | ICD-10-CM

## 2019-04-08 DIAGNOSIS — Z888 Allergy status to other drugs, medicaments and biological substances status: Secondary | ICD-10-CM

## 2019-04-08 DIAGNOSIS — Z91048 Other nonmedicinal substance allergy status: Secondary | ICD-10-CM

## 2019-04-08 NOTE — Patient Instructions (Signed)
You have been referred to me for rt finger infection which you have had for the past 3 months. You have been on 3 courses of bactrim, prednisone without help- I will discuss with your ortho doctor - you will need tissue culture sent for fungus, atypical mycobacteria and regular bacteria before we can treat you correctly

## 2019-04-08 NOTE — Progress Notes (Signed)
NAME: Susan West  DOB: [birth year]  MRN: [id number]  Date/Time: 04/08/2019 10:40 AM  REQUESTING PROVIDER:Dr.Soria Subjective:  REASON FOR CONSULT: Osteomyelitis of the right index finger ? Susan West is a 48 y.o. female with a history of BRCA1 positive, status post TAH and BSO  is referred to me by her hand surgeon Dr. Peggye Ley for osteomyelitis of the right index finger. 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 she does not remember sustaining any injury specifically to the right index finger.  She did notice however almost a year ago that she was having an under nail almost like a double nail.  Her manicurist had mentioned this to her.  There was some pain when she pressed on the tip of her finger.  She then noted a darker ridge vertically in the middle of the finger.   She is also had bilateral hand pain and has been getting physical therapy for degenerative cervical spine disease..  She has also noticed that her fingers can sometimes turn red and she has some cramping in her fingers.  This is present sometimes at nighttime.      She also says she is being treated for trigger fingers In June 2020 while she was walking with her friends she was chased by a horse fly and tried to swatted and fell on her extended hand.  She then noted swelling of her index finger and thumb area on the dorsal surface of the hand..  There was no obvious skin tear or external injuries.     She went to urgent care and was given Bactrim initially.  Then she received a course of prednisone.  And then Bactrim again.  There was some improvement with Bactrim only for the swelling to recur again after she stopped it.  She noted that her right index finger was painful and swollen.  She was referred to [facility] and saw hand surgeon.  She had an x-ray of the hand done in August that showed possible avulsion fracture of the triquetrum at the wrist.  An  x-ray of the index finger did not reveal any bone abnormalities.  She was given another course of Bactrim for possible osteo-.  She then went back to see Dr. Peggye Ley and had MRI last week which showed increase uptake in the distal phalanx and there was a question of osteomyelitis.  yesterday patient had the fingernail removed by the surgeon. Was given a prescription for keflex and cipro for 6 weeks. No cultures have been sent. She has a few ducks, cats and dogs.  No bites or scratches from animals. Past Medical History:  Diagnosis Date  . Acne    adult-on doxycycline daily  . ADD (attention deficit disorder)   . Allergy   . Asthma    EXERCISE INDUCED  . BRCA1 positive 01/2017  . Depression 2006   and anxiety  . Environmental allergies   . Headache, migraine   . history of abnormal Pap smear       . Osteopenia 2006  . PONV (postoperative nausea and vomiting)   . Seasonal allergies     Past Surgical History:  Procedure Laterality Date  . AUGMENTATION MAMMAPLASTY Bilateral 01/2001  . BREAST ENHANCEMENT SURGERY  2002   bilateral implants  . CESAREAN SECTION  06/2000  . COSMETIC SURGERY    . LAPAROSCOPIC HYSTERECTOMY  03/06/2018   wtih LSO  . LAPAROTOMY  04/21/2012  Procedure: EXPLORATORY LAPAROTOMY;  Surgeon: Janie Morning, MD PHD;  Location: [facility];  Service: Gynecology;  Laterality: N/A;  . NASAL SINUS SURGERY     1999  . SALPINGOOPHORECTOMY  04/21/2012   Procedure: SALPINGO OOPHERECTOMY;  Surgeon: Janie Morning, MD PHD;  Location: [facility];  Service: Gynecology;  Laterality: Right;    Social History   Socioeconomic History  . Marital status: Divorced    Spouse name: Not on file  . Number of children: Not on file  . Years of education: Not on file  . Highest education level: Not on file  Occupational History  . Not on file  Social Needs  . Financial resource strain: Not on file  . Food insecurity    Worry: Not on file    Inability: Not on file  . Transportation needs     Medical: Not on file    Non-medical: Not on file  Tobacco Use  . Smoking status: Never Smoker  . Smokeless tobacco: Never Used  Substance and Sexual Activity  . Alcohol use: Yes    Alcohol/week: 0.0 - 1.0 standard drinks  . Drug use: Not Currently  . Sexual activity: Not Currently    Partners: Male    Birth control/protection: Surgical  Lifestyle  . Physical activity    Days per week: Not on file    Minutes per session: Not on file  . Stress: Not on file  Relationships  . Social Herbalist on phone: Not on file    Gets together: Not on file    Attends religious service: Not on file    Active member of club or organization: Not on file    Attends meetings of clubs or organizations: Not on file    Relationship status: Not on file  . Intimate partner violence    Fear of current or ex partner: Not on file    Emotionally abused: Not on file    Physically abused: Not on file    Forced sexual activity: Not on file  Other Topics Concern  . Not on file  Social History Narrative  . Not on file    Family History  Problem Relation Age of Onset  . Cancer Father        prostate, bone  . Hypertension Maternal Grandmother   . Hypertension Paternal Grandfather   . Heart attack Paternal Grandfather   . Hypertension Paternal Grandmother   . Hyperlipidemia Sister   . Breast cancer Neg Hx    Allergies  Allergen Reactions  . Penicillins Nausea Only    Extreme stomach pain Extreme stomach pain  . Azithromycin Nausea Only    Other Reactions Per Pt: Back Pain, Menstrual Spotting, Abdominal Cramping  . Doxycycline Other (See Comments)    Yeast infection  Yeast infection   . Loratadine Other (See Comments)    migraines migraines  . Tdap [Tetanus-Diphth-Acell Pertussis]     Significant swelling x one month  . Ultracet [Tramadol-Acetaminophen] Nausea And Vomiting  . Gold Rash   Current Outpatient Medications  Medication Sig Dispense Refill  . cetirizine (ZYRTEC) 10 MG  tablet Take 10 mg by mouth daily.    [name] gabapentin (NEURONTIN) 300 MG capsule TAKE 1 CAPSULE BY MOUTH EVERYDAY AT BEDTIME  0  . rizatriptan (MAXALT) 10 MG tablet Take 1 tablet (10 mg total) by mouth as needed for migraine. May repeat in 2 hours if needed 10 tablet 12   No current facility-administered medications for this visit.  Abtx:  Anti-infectives (From admission, onward)   None      REVIEW OF SYSTEMS:  Const: negative fever, negative chills, negative weight loss Eyes: negative diplopia or visual changes, negative eye pain ENT: negative coryza, negative sore throat Resp: negative cough, hemoptysis, dyspnea Cards: negative for chest pain, palpitations, lower extremity edema GU: negative for frequency, dysuria and hematuria GI: Negative for abdominal pain, diarrhea, bleeding, constipation Skin: negative for rash and pruritus Heme: negative for easy bruising and gum/nose bleeding MS: negative for myalgias, arthralgias, back pain and muscle weakness Neurolo:negative for headaches, dizziness, vertigo, memory problems  Psych: negative for feelings of anxiety, depression  Endocrine: negative for thyroid, diabetes Allergy/Immunology-penicillin causes stomach pain.  Z-Pak also has caused some GI side effects ?  Objective:  VITALS:  BP 126/88 (BP Location: Left Arm, Patient Position: Sitting, Cuff Size: Normal)   Pulse 80   Temp (!) 97 F (36.1 C) (Oral)   Ht _0  (1.575 m)   Wt 133 lb (60.3 kg)   LMP 12/08/2017 (Within Days)   BMI 24.33 kg/m  PHYSICAL EXAM:  General: Alert, cooperative, no distress, appears stated age.  Head: Normocephalic, without obvious abnormality, atraumatic. Eyes: Conjunctivae clear, anicteric sclerae. Pupils are equal Extremities: Right index finger nail has been removed Nailbed is red granulation tissue Mild erythema of the distal finger with some swelling       Skin: No rashes or lesions. Or bruising Neurologic: Grossly non-focal  Pertinent Labs Lab Results CBC    Component Value Date/Time   WBC 4.4 12/07/2015 1048   RBC 4.14 12/07/2015 1048   HGB 13.5 12/07/2015 1048   HGB 13.2 09/30/2014 0907   HCT 39.5 12/07/2015 1048   PLT 292 12/07/2015 1048   MCV 95.4 12/07/2015 1048   MCH 32.6 12/07/2015 1048   MCHC 34.2 12/07/2015 1048   RDW 13.3 12/07/2015 1048   LYMPHSABS 2.1 04/17/2012 1215   MONOABS 0.5 04/17/2012 1215   EOSABS 0.1 04/17/2012 1215   BASOSABS 0.0 04/17/2012 1215    CMP Latest Ref Rng & Units 12/07/2015 04/22/2012 04/17/2012  Glucose 65 - 99 mg/dL 83 192(H) 80  BUN 7 - 25 mg/dL 11 5(L) 13  Creatinine 0.50 - 1.10 mg/dL 0.73 0.59 0.86  Sodium 135 - 146 mmol/L 138 132(L) 137  Potassium 3.5 - 5.3 mmol/L 4.1 4.5 3.8  Chloride 98 - 110 mmol/L 102 103 101  CO2 20 - 31 mmol/L _1 Calcium 8.6 - 10.2 mg/dL 8.8 8.0(L) 9.4  Total Protein 6.1 - 8.1 g/dL 6.7 - 6.9  Total Bilirubin 0.2 - 1.2 mg/dL 0.9 - 0.4  Alkaline Phos 33 - 115 U/L 35 - 44  AST 10 - 35 U/L 13 - 14  ALT 6 - 29 U/L 9 - 14      Microbiology: none IMAGING RESULTS: No films to review-saw faxed report ? Impression/Recommendation ? ?Rt index finger inflammation with distal phalanx osteomyelitis- no response to Po antibiotic- bactrim This is chronic inflammation and we need to r/o Fungal infection or atypical mycobacterial infection as she is an avid gardener and could have unknowingly injured her finger or sustained a prick from a thorn or wood. She will need biopsy and culture from the soft tissue/ bone. Also bone to be sent for HPE Called Dr.Soria and spoke to her- explained her risk for atypical  Infections Told patient not to take any more antibiotics  Wonder whether she has concurrent arthritis of her hands vs Reflex sympathetic dystrophy  Spent 50% of the 45 min counseling patient about her condition and coordinating care with her hand surgeon 'will follow her .  ________________________________________________  Discussed with patient and requesting provider Note:  This document was prepared using Dragon voice recognition software and may include unintentional dictation errors.

## 2019-05-25 ENCOUNTER — Encounter: Payer: Self-pay | Admitting: Infectious Diseases

## 2019-05-25 ENCOUNTER — Ambulatory Visit: Payer: BC Managed Care – PPO | Attending: Infectious Diseases | Admitting: Infectious Diseases

## 2019-05-25 ENCOUNTER — Other Ambulatory Visit: Payer: Self-pay

## 2019-05-25 VITALS — BP 130/82 | HR 78 | Resp 16 | Ht 62.0 in | Wt 135.0 lb

## 2019-05-25 DIAGNOSIS — M7989 Other specified soft tissue disorders: Secondary | ICD-10-CM

## 2019-05-25 DIAGNOSIS — Z885 Allergy status to narcotic agent status: Secondary | ICD-10-CM

## 2019-05-25 DIAGNOSIS — Z887 Allergy status to serum and vaccine status: Secondary | ICD-10-CM

## 2019-05-25 DIAGNOSIS — Z881 Allergy status to other antibiotic agents status: Secondary | ICD-10-CM

## 2019-05-25 DIAGNOSIS — Z880 Allergy status to penicillin: Secondary | ICD-10-CM

## 2019-05-25 DIAGNOSIS — Z90722 Acquired absence of ovaries, bilateral: Secondary | ICD-10-CM

## 2019-05-25 DIAGNOSIS — M5030 Other cervical disc degeneration, unspecified cervical region: Secondary | ICD-10-CM

## 2019-05-25 DIAGNOSIS — Z888 Allergy status to other drugs, medicaments and biological substances status: Secondary | ICD-10-CM

## 2019-05-25 DIAGNOSIS — Z90710 Acquired absence of both cervix and uterus: Secondary | ICD-10-CM

## 2019-05-25 DIAGNOSIS — Z91048 Other nonmedicinal substance allergy status: Secondary | ICD-10-CM

## 2019-05-25 DIAGNOSIS — L089 Local infection of the skin and subcutaneous tissue, unspecified: Secondary | ICD-10-CM | POA: Diagnosis not present

## 2019-05-25 NOTE — Progress Notes (Signed)
NAME: Susan West  DOB: 21-Apr-1971  MRN: [id number]  Date/Time: 05/25/2019 11:29 AM  Subjective:  Pt here for follow up I had seen Susan West on 04/08/2019 when she was referred to me by Dr. Peggye Ley her hand surgeon for right index finger possible osteomyelitis.  Her history is as follows  ? Susan West is a 48 y.o. female with a history of BRCA1 gene positive, status post TAH and BSO was referred to me by her hand surgeon Dr. Peggye Ley for osteomyelitis of the right index finger on 04/08/19. 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 she does not remember sustaining any injury specifically to the right index finger.  She did notice however almost a year or more that she was having an under nail almost like a double nail.  Her manicurist had mentioned this to her.  There was some pain when she pressed on the tip of her finger.  She then noted a darker ridge vertically in the middle of the finger.   She  also was bilateral hand pain and has been getting physical therapy for degenerative cervical spine disease..  She has also noticed that her fingers can sometimes turn red and she has some cramping in her fingers.  This is present sometimes at nighttime.      She also says she was diagnosed with trigger fingers In June 2020 while she was walking with her friends she was chased by a horse fly and fell on her extended hand.  She then noted swelling of her index finger and thumb area on the dorsal surface of the hand..That part was black and held a cold water bottle and it went away  There was no obvious skin tear or external injuries.     She went to urgent care and was given Bactrim initially.  Then she received a course of prednisone.  And then Bactrim again.  There was some improvement with Bactrim only for the swelling to recur again after she stopped it.  She noted that her right index finger was painful and swollen.  She was referred to  [facility] and saw hand surgeon.  She had an x-ray of the hand done in August that showed possible avulsion fracture of the triquetrum at the wrist.  An x-ray of the index finger did not reveal any bone abnormalities.  She was given another course of Bactrim for possible osteo-.  She then went back to see Dr. Peggye Ley and had MRI which showed increase uptake in the distal phalanx and there was a question of osteomyelitis.  On 03/28/19  the fingernail was removed by the surgeon.No culture or pathology was sent. 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  Pt has had neck.shoulder/arms symptoms for many years- then her hands became numb, difficult to pick a phone , a gallon of milk, excruciating pain in neck, went to Dr.Cram ( neurologist) and referred to PT She has a few ducks, cats and dogs.  No bites or scratches from animals  On 04/23/19 she underwent bone biopsy by her hand surgeon and sent for pathology and cultures including mycobacteria and fungus   The pathology result shows only remodelling  bone and no mention of osteomyelitis. She is here for follow up. Pt says the surgical site has heeled but she she has pain and numbness in the rt index finger, sometimes has difficulty opening lids Past Medical History:  Diagnosis Date  . Acne    adult-on doxycycline daily  . ADD (attention deficit disorder)   . Allergy   . Asthma    EXERCISE INDUCED  . BRCA1 positive 01/2017  . Depression 2006   and anxiety  . Environmental allergies   . Headache, migraine   . history of abnormal Pap smear       . Osteopenia 2006  . PONV (postoperative nausea and vomiting)   . Seasonal allergies     Past Surgical History:  Procedure Laterality Date  . AUGMENTATION MAMMAPLASTY  Bilateral 01/2001  . BREAST ENHANCEMENT SURGERY  2002   bilateral implants  . CESAREAN SECTION  06/2000  . COSMETIC SURGERY    . LAPAROSCOPIC HYSTERECTOMY  03/06/2018   wtih LSO  . LAPAROTOMY  04/21/2012   Procedure: EXPLORATORY LAPAROTOMY;  Surgeon: Janie Morning, MD PHD;  Location: [facility];  Service: Gynecology;  Laterality: N/A;  . NASAL SINUS SURGERY     1999  . SALPINGOOPHORECTOMY  04/21/2012   Procedure: SALPINGO OOPHERECTOMY;  Surgeon: Janie Morning, MD PHD;  Location: [facility];  Service: Gynecology;  Laterality: Right;    Social History   Socioeconomic History  . Marital status: Divorced    Spouse name: Not on file  . Number of children: Not on file  . Years of education: Not on file  . Highest education level: Not on file  Occupational History  . Not on file  Social Needs  . Financial resource strain: Not on file  . Food insecurity    Worry: Not on file    Inability: Not on file  . Transportation needs    Medical: Not on file    Non-medical: Not on file  Tobacco Use  . Smoking status: Never Smoker  . Smokeless tobacco: Never Used  Substance and Sexual Activity  . Alcohol use: Yes    Alcohol/week: 0.0 - 1.0 standard drinks  . Drug use: Not Currently  . Sexual activity: Not Currently    Partners: Male    Birth control/protection: Surgical  Lifestyle  . Physical activity    Days per week: Not on file    Minutes per session: Not on file  . Stress: Not on file  Relationships  . Social Herbalist on phone: Not on file    Gets together: Not on file    Attends religious service: Not on file    Active member of club or organization: Not on file    Attends meetings of clubs or organizations: Not on file    Relationship status: Not on file  . Intimate partner violence    Fear of current or ex partner: Not on file    Emotionally abused: Not on file    Physically abused: Not on file    Forced sexual activity: Not on file  Other Topics Concern  . Not on  file  Social History Narrative  . Not on file    Family History  Problem Relation Age of Onset  . Cancer Father        prostate, bone  . Hypertension Maternal Grandmother   . Hypertension Paternal Grandfather   . Heart attack Paternal Grandfather   . Hypertension Paternal Grandmother   . Hyperlipidemia Sister   .  Breast cancer Neg Hx    Allergies  Allergen Reactions  . Penicillins Nausea Only    Extreme stomach pain Extreme stomach pain  . Azithromycin Nausea Only    Other Reactions Per Pt: Back Pain, Menstrual Spotting, Abdominal Cramping  . Doxycycline Other (See Comments)    Yeast infection  Yeast infection   . Loratadine Other (See Comments)    migraines migraines  . Tdap [Tetanus-Diphth-Acell Pertussis]     Significant swelling x one month  . Ultracet [Tramadol-Acetaminophen] Nausea And Vomiting  . Gold Rash   Current Outpatient Medications  Medication Sig Dispense Refill  . cephALEXin (KEFLEX) 500 MG capsule Take 500 mg by mouth every 6 (six) hours.    . cetirizine (ZYRTEC) 10 MG tablet Take 10 mg by mouth daily.    . ciprofloxacin (CIPRO) 500 MG tablet SMARTSIG:1 Tablet(s) By Mouth Every 12 Hours    . gabapentin (NEURONTIN) 300 MG capsule TAKE 1 CAPSULE BY MOUTH EVERYDAY AT BEDTIME  0  . rizatriptan (MAXALT) 10 MG tablet Take 1 tablet (10 mg total) by mouth as needed for migraine. May repeat in 2 hours if needed 10 tablet 12   No current facility-administered medications for this visit.      Abtx:  Anti-infectives (From admission, onward)   None      REVIEW OF SYSTEMS:  Const: negative fever, negative chills, negative weight loss Eyes: negative diplopia or visual changes, negative eye pain ENT: negative coryza, negative sore throat Resp: negative cough, hemoptysis, dyspnea Cards: negative for chest pain, palpitations, lower extremity edema GU: negative for frequency, dysuria and hematuria GI: Negative for abdominal pain, diarrhea, bleeding, constipation  Skin: negative for rash and pruritus Heme: negative for easy bruising and gum/nose bleeding MS: negative for myalgias, arthralgias, back pain and muscle weakness. Pain hands, rt index finger Neurolo:negative for headaches, dizziness, vertigo, memory problems  Psych: negative for feelings of anxiety, depression  Endocrine: negative for thyroid, diabetes Allergy/Immunology-penicillin causes stomach pain.  Z-Pak also has caused some GI side effects ?  Objective:  VITALS:  BP 130/82   Pulse 78   Resp 16   Ht '5\' 2"'$  (1.575 m)   Wt 135 lb (61.2 kg)   LMP 12/08/2017 (Within Days)   SpO2 99%   BMI 24.69 kg/m  PHYSICAL EXAM:  General: Alert, cooperative, no distress, appears stated age.  Head: Normocephalic, without obvious abnormality, atraumatic. Eyes: Conjunctivae clear, anicteric sclerae. Pupils are equal Extremities: Right index finger 05/25/19         04/08/19       Skin: No rashes or lesions. Or bruising Neurologic: Grossly non-focal Pertinent Labs Lab Results CBC    Component Value Date/Time   WBC 4.4 12/07/2015 1048   RBC 4.14 12/07/2015 1048   HGB 13.5 12/07/2015 1048   HGB 13.2 09/30/2014 0907   HCT 39.5 12/07/2015 1048   PLT 292 12/07/2015 1048   MCV 95.4 12/07/2015 1048   MCH 32.6 12/07/2015 1048   MCHC 34.2 12/07/2015 1048   RDW 13.3 12/07/2015 1048   LYMPHSABS 2.1 04/17/2012 1215   MONOABS 0.5 04/17/2012 1215   EOSABS 0.1 04/17/2012 1215   BASOSABS 0.0 04/17/2012 1215    CMP Latest Ref Rng & Units 12/07/2015 04/22/2012 04/17/2012  Glucose 65 - 99 mg/dL 83 192(H) 80  BUN 7 - 25 mg/dL 11 5(L) 13  Creatinine 0.50 - 1.10 mg/dL 0.73 0.59 0.86  Sodium 135 - 146 mmol/L 138 132(L) 137  Potassium 3.5 - 5.3 mmol/L 4.1 4.5  3.8  Chloride 98 - 110 mmol/L 102 103 101  CO2 20 - 31 mmol/L '26 21 28  '$ Calcium 8.6 - 10.2 mg/dL 8.8 8.0(L) 9.4  Total Protein 6.1 - 8.1 g/dL 6.7 - 6.9  Total Bilirubin 0.2 - 1.2 mg/dL 0.9 - 0.4  Alkaline Phos 33 - 115 U/L 35 - 44   AST 10 - 35 U/L 13 - 14  ALT 6 - 29 U/L 9 - 14      Microbiology: none IMAGING RESULTS: No films to review-saw faxed report ? Impression/Recommendation ? ?Rt index finger inflammation with distal phalanx enhancement on MRI suggestive of osteo- bone biopsy done on 10/2 shows no osteomyeitis- only remodeled bone - no response to Po antibiotic- bactrim  Fungal culture and  atypical mycobacterial culture pending  Spoke to the hand surgeon and asked her to contact the pathologist for special stains and to look for any granuloma. Currently there is no organism to treat.await finalization of culture    [facility] whether she has concurrent arthritis of her hands vs Reflex sympathetic dystrophy Vs raynauds. Will refer to rheumatology     ________________________________________________ Discussed with patient and requesting provider Note:  This document was prepared using Dragon voice recognition software and may include unintentional dictation errors.

## 2019-05-25 NOTE — Patient Instructions (Addendum)
Today you are here for follow up of the rt index finger swelling- you have been worked up for possible osteomyelitis of the bone of the phalanx- Bone biopsy was done and it was Normal ( I have not seen the result) fungal culture and myycobacterial culture pending. You have an underlying degenerative issue of the hands and neck-will refer to Dr.Rohrbach (rheumatologist at [facility] clinic) [phone]

## 2019-05-31 ENCOUNTER — Telehealth: Payer: Self-pay

## 2019-05-31 NOTE — Telephone Encounter (Signed)
Returned call from VM left Friday PM. I left message stating that I do not see culture results but that it takes a few days and I will call when I hear from Dr. Mauricia Area.

## 2019-06-15 ENCOUNTER — Telehealth: Payer: Self-pay | Admitting: Obstetrics & Gynecology

## 2019-06-15 NOTE — Telephone Encounter (Signed)
Patient has questions regarding what type of tdap vaccine she had in our office?

## 2019-06-15 NOTE — Telephone Encounter (Signed)
Spoke to pt. Pt states wanting to know which tdap we carry in our office. Pt told Boostrix in office currently, but also carried Adacel possibly in 2010.  Pt had reaction( swollen x 1 month)  in 2010 after receiving tdap here and her work office is now asking for her to get update vaccination.   Consulted with Johny Shock, CNM for recommendations. Advised pt  to not get vaccination, but get records showing reaction from 2010. Pt agreeable.  Will route to D. Hollice Espy, CNM for review Dr Lestine Box

## 2019-06-16 NOTE — Telephone Encounter (Signed)
Agree with plan 

## 2019-10-22 ENCOUNTER — Other Ambulatory Visit: Payer: Self-pay | Admitting: Obstetrics & Gynecology

## 2019-10-22 DIAGNOSIS — G43709 Chronic migraine without aura, not intractable, without status migrainosus: Secondary | ICD-10-CM

## 2020-04-03 ENCOUNTER — Ambulatory Visit (INDEPENDENT_AMBULATORY_CARE_PROVIDER_SITE_OTHER): Payer: BC Managed Care – PPO | Admitting: Dermatology

## 2020-04-03 ENCOUNTER — Other Ambulatory Visit: Payer: Self-pay

## 2020-04-03 DIAGNOSIS — L578 Other skin changes due to chronic exposure to nonionizing radiation: Secondary | ICD-10-CM

## 2020-04-03 DIAGNOSIS — D229 Melanocytic nevi, unspecified: Secondary | ICD-10-CM | POA: Diagnosis not present

## 2020-04-03 DIAGNOSIS — Z1283 Encounter for screening for malignant neoplasm of skin: Secondary | ICD-10-CM

## 2020-04-03 DIAGNOSIS — L821 Other seborrheic keratosis: Secondary | ICD-10-CM

## 2020-04-03 DIAGNOSIS — L820 Inflamed seborrheic keratosis: Secondary | ICD-10-CM

## 2020-04-03 DIAGNOSIS — L570 Actinic keratosis: Secondary | ICD-10-CM

## 2020-04-03 DIAGNOSIS — L814 Other melanin hyperpigmentation: Secondary | ICD-10-CM

## 2020-04-03 DIAGNOSIS — D1800 Hemangioma unspecified site: Secondary | ICD-10-CM

## 2020-04-03 NOTE — Patient Instructions (Signed)
Recommend daily broad spectrum sunscreen SPF 30+ to sun-exposed areas, reapply every 2 hours as needed. Call for new or changing lesions.  

## 2020-04-03 NOTE — Progress Notes (Addendum)
Follow-Up Visit   Subjective  Susan West is a 49 y.o. female who presents for the following: TBSE.  She has history of Pre-Cancers treated in past. She has been trying to use sunscreens and sun protection more diligently lately. Patient presents today for TBSE, has a few areas of concern today, on her R eyebrow, L Cheek and B/L arms. Patient states that she has no h/o of skin cancer.  The patient presents for Total-Body Skin Exam (TBSE) for skin cancer screening and mole check.  The following portions of the chart were reviewed this encounter and updated as appropriate:  Tobacco  Allergies  Meds  Problems  Med Hx  Surg Hx  Fam Hx     Review of Systems:  No other skin or systemic complaints except as noted in HPI or Assessment and Plan.  Objective  Well appearing patient in no apparent distress; mood and affect are within normal limits.  A full examination was performed including scalp, head, eyes, ears, nose, lips, neck, chest, axillae, abdomen, back, buttocks, bilateral upper extremities, bilateral lower extremities, hands, feet, fingers, toes, fingernails, and toenails. All findings within normal limits unless otherwise noted below.  Objective  Right Eyebrow, Right chest: Erythematous thin papules/macules with gritty scale.   Objective  Chest x 2 (2), Left Cheek: Erythematous keratotic or waxy stuck-on papule or plaque.    Assessment & Plan  AK (actinic keratosis) (2) Right chest; Right Eyebrow  Cryotherapy today Prior to procedure, discussed risks of blister formation, small wound, skin dyspigmentation, or rare scar following cryotherapy.    Destruction of lesion - Right Eyebrow, Right chest Complexity: simple   Destruction method: cryotherapy   Informed consent: discussed and consent obtained   Timeout:  patient name, date of birth, surgical site, and procedure verified Lesion destroyed using liquid nitrogen: Yes   Region frozen until ice ball extended  beyond lesion: Yes   Outcome: patient tolerated procedure well with no complications   Post-procedure details: wound care instructions given    Inflamed seborrheic keratosis (3) Chest x 2 (2); Left Cheek  Cryotherapy today Prior to procedure, discussed risks of blister formation, small wound, skin dyspigmentation, or rare scar following cryotherapy.    Destruction of lesion - Chest x 2, Left Cheek Complexity: simple   Destruction method: cryotherapy   Informed consent: discussed and consent obtained   Timeout:  patient name, date of birth, surgical site, and procedure verified Lesion destroyed using liquid nitrogen: Yes   Region frozen until ice ball extended beyond lesion: Yes   Outcome: patient tolerated procedure well with no complications   Post-procedure details: wound care instructions given    Skin cancer screening   Lentigines - Scattered tan macules - Discussed due to sun exposure - Benign, observe - Call for any changes  Seborrheic Keratoses - Stuck-on, waxy, tan-brown papules and plaques  - Discussed benign etiology and prognosis. - Observe - Call for any changes  Melanocytic Nevi - Tan-brown and/or pink-flesh-colored symmetric macules and papules - Benign appearing on exam today - Observation - Call clinic for new or changing moles - Recommend daily use of broad spectrum spf 30+ sunscreen to sun-exposed areas.   Hemangiomas - Red papules - Discussed benign nature - Observe - Call for any changes  Actinic Damage - diffuse scaly erythematous macules with underlying dyspigmentation - Recommend daily broad spectrum sunscreen SPF 30+ to sun-exposed areas, reapply every 2 hours as needed.  - Call for new or changing lesions.  Skin  cancer screening performed today.  Return in about 1 year (around 04/03/2021) for TBSE.  I, Donzetta Kohut, CMA, am acting as scribe for Sarina Ser, MD . Documentation: I have reviewed the above documentation for accuracy  and completeness, and I agree with the above.  Sarina Ser, MD

## 2020-04-04 ENCOUNTER — Encounter: Payer: Self-pay | Admitting: Dermatology

## 2020-04-13 ENCOUNTER — Other Ambulatory Visit: Payer: Self-pay | Admitting: Obstetrics & Gynecology

## 2020-04-13 DIAGNOSIS — G43709 Chronic migraine without aura, not intractable, without status migrainosus: Secondary | ICD-10-CM

## 2020-04-13 NOTE — Telephone Encounter (Signed)
Med refill request: Maxalt  Last AEX: 04/01/2019 Next AEX: 05/25/2020 with SM Last MMG (if hormonal med) n/a Refill authorized: Rx pended if approved, # 10, 12 RF   Routing to Dr Sabra Heck

## 2020-04-13 NOTE — Telephone Encounter (Signed)
Patient is requesting a refill for Maxalt to be sent to [facility].

## 2020-04-14 MED ORDER — RIZATRIPTAN BENZOATE 10 MG PO TABS: 10.0000 mg | ORAL_TABLET | ORAL | 12 refills | Status: DC | PRN

## 2020-05-25 ENCOUNTER — Ambulatory Visit (INDEPENDENT_AMBULATORY_CARE_PROVIDER_SITE_OTHER): Payer: BC Managed Care – PPO | Admitting: Obstetrics & Gynecology

## 2020-05-25 ENCOUNTER — Other Ambulatory Visit: Payer: Self-pay

## 2020-05-25 ENCOUNTER — Encounter: Payer: Self-pay | Admitting: Obstetrics & Gynecology

## 2020-05-25 VITALS — BP 100/60 | HR 80 | Resp 16 | Ht 62.75 in | Wt 128.0 lb

## 2020-05-25 DIAGNOSIS — Z0000 Encounter for general adult medical examination without abnormal findings: Secondary | ICD-10-CM | POA: Diagnosis not present

## 2020-05-25 DIAGNOSIS — R5383 Other fatigue: Secondary | ICD-10-CM | POA: Diagnosis not present

## 2020-05-25 DIAGNOSIS — Z01419 Encounter for gynecological examination (general) (routine) without abnormal findings: Secondary | ICD-10-CM | POA: Diagnosis not present

## 2020-05-25 NOTE — Progress Notes (Signed)
49 y.o. [id number] Divorced White or Caucasian female here for annual exam.  Doing well.  Has experienced some neck/shoulder issues this past year.  Used gabapentin.  Has completed that now.  Denies vaginal bleeding.    Followed at [facility] and breast MRI.    Patient's last menstrual period was 12/08/2017 (within days).          Sexually active: Yes.    The current method of family planning is status post hysterectomy.    Exercising: Yes.    once a week Smoker:  no  Health Maintenance: Pap:  12/25/17 Neg:Neg HR HPV  09/30/14 Neg:Neg HR HPV History of abnormal Pap:  no MMG:  03/29/20 BIRADS 2 benign -- in Care Everywhere Colonoscopy:  10/08/19 f/u 10 years BMD:   n/a TDaP:  Patient declines, had swelling and fever with last injection Pneumonia vaccine(s):  n/a Shingrix:   n/a Hep C testing: n/a Screening Labs: discuss today   reports that she has never smoked. She has never used smokeless tobacco. She reports current alcohol use. She reports previous drug use.  Past Medical History:  Diagnosis Date  . Acne    adult-on doxycycline daily  . ADD (attention deficit disorder)   . Allergy   . Asthma    EXERCISE INDUCED  . BRCA1 positive 01/2017  . Depression 2006   and anxiety  . Environmental allergies   . Headache, migraine   . history of abnormal Pap smear       . Osteopenia 2006  . PONV (postoperative nausea and vomiting)   . Seasonal allergies     Past Surgical History:  Procedure Laterality Date  . AUGMENTATION MAMMAPLASTY Bilateral 01/2001  . BREAST ENHANCEMENT SURGERY  2002   bilateral implants  . CESAREAN SECTION  06/2000  . COSMETIC SURGERY    . LAPAROSCOPIC HYSTERECTOMY  03/06/2018   wtih LSO  . LAPAROTOMY  04/21/2012   Procedure: EXPLORATORY LAPAROTOMY;  Surgeon: Janie Morning, MD PHD;  Location: [facility];  Service: Gynecology;  Laterality: N/A;  . NASAL SINUS SURGERY     1999  . SALPINGOOPHORECTOMY  04/21/2012   Procedure: SALPINGO OOPHERECTOMY;  Surgeon:  Janie Morning, MD PHD;  Location: [facility];  Service: Gynecology;  Laterality: Right;    Current Outpatient Medications  Medication Sig Dispense Refill  . cetirizine (ZYRTEC) 10 MG tablet Take 10 mg by mouth daily.    . Multiple Vitamins-Minerals (HAIR SKIN AND NAILS FORMULA PO) Take by mouth.    . Nutritional Supplements (JUICE PLUS FIBRE PO) Take by mouth.    . rizatriptan (MAXALT) 10 MG tablet Take 1 tablet (10 mg total) by mouth as needed for migraine. May repeat in 2 hours if needed 10 tablet 12   No current facility-administered medications for this visit.    Family History  Problem Relation Age of Onset  . Cancer Father        prostate, bone  . Hypertension Maternal Grandmother   . Hypertension Paternal Grandfather   . Heart attack Paternal Grandfather   . Hypertension Paternal Grandmother   . Hyperlipidemia Sister   . Breast cancer Neg Hx     Review of Systems  Constitutional:       Lack of energy Difficulty with sleep   All other systems reviewed and are negative.   Exam:   BP 100/60 (BP Location: Right Arm, Patient Position: Sitting, Cuff Size: Normal)   Pulse 80   Resp 16   Ht 5' 2.75" (1.594  m)   Wt 128 lb (58.1 kg)   LMP 12/08/2017 (Within Days)   BMI 22.86 kg/m   Height: 5' 2.75" (159.4 cm)  General appearance: alert, cooperative and appears stated age Head: Normocephalic, without obvious abnormality, atraumatic Neck: no adenopathy, supple, symmetrical, trachea midline and thyroid normal to inspection and palpation Lungs: clear to auscultation bilaterally Breasts: normal appearance, no masses or tenderness Heart: regular rate and rhythm Abdomen: soft, non-tender; bowel sounds normal; no masses,  no organomegaly Extremities: extremities normal, atraumatic, no cyanosis or edema Skin: Skin color, texture, turgor normal. No rashes or lesions Lymph nodes: Cervical, supraclavicular, and axillary nodes normal. No abnormal inguinal nodes palpated Neurologic:  Grossly normal   Pelvic: External genitalia:  no lesions              Urethra:  normal appearing urethra with no masses, tenderness or lesions              Bartholins and Skenes: normal                 Vagina: normal appearing vagina with normal color and discharge, no lesions              Cervix: no lesions              Pap taken: No. Bimanual Exam:  Uterus:  normal size, contour, position, consistency, mobility, non-tender              Adnexa: normal adnexa and no mass, fullness, tenderness               Rectovaginal: Confirms               Anus:  normal sphincter tone, no lesions  Chaperone, Olene Floss, CMA, was present for exam.  A:  Well Woman with normal exam PMP, no HRT H/o RSO due to mature teratoma 10/13 H/o TLH with LSO due to BRCA 1 gene mutation Migraines H/o asthma More recent fatigue Mild vaginal atrophic changes  P:   Mammogram is done every year and this alternatives with breast MRI every 6 months pap smear not indicated RF for Maxalt has already been done for the year Declines Tdap CBC, CMP, Lipids, TSH, Vit D, Ferritin Colonoscopy 09/2019 Vit e vaginal suppositories  return annually or prn

## 2020-05-26 LAB — COMPREHENSIVE METABOLIC PANEL
ALT: 17 IU/L (ref 0–32)
AST: 17 IU/L (ref 0–40)
Albumin/Globulin Ratio: 2 (ref 1.2–2.2)
Albumin: 4.4 g/dL (ref 3.8–4.8)
Alkaline Phosphatase: 64 IU/L (ref 44–121)
BUN/Creatinine Ratio: 21 (ref 9–23)
BUN: 16 mg/dL (ref 6–24)
Bilirubin Total: 0.6 mg/dL (ref 0.0–1.2)
CO2: 24 mmol/L (ref 20–29)
Calcium: 9 mg/dL (ref 8.7–10.2)
Chloride: 104 mmol/L (ref 96–106)
Creatinine, Ser: 0.76 mg/dL (ref 0.57–1.00)
GFR calc Af Amer: 106 mL/min/{1.73_m2} (ref >59)
GFR calc non Af Amer: 92 mL/min/{1.73_m2} (ref >59)
Globulin, Total: 2.2 g/dL (ref 1.5–4.5)
Glucose: 82 mg/dL (ref 65–99)
Potassium: 4.1 mmol/L (ref 3.5–5.2)
Sodium: 141 mmol/L (ref 134–144)
Total Protein: 6.6 g/dL (ref 6.0–8.5)

## 2020-05-26 LAB — LIPID PANEL
Chol/HDL Ratio: 2.8 ratio (ref 0.0–4.4)
Cholesterol, Total: 192 mg/dL (ref 100–199)
HDL: 68 mg/dL (ref >39)
LDL Chol Calc (NIH): 114 mg/dL — ABNORMAL HIGH (ref 0–99)
Triglycerides: 54 mg/dL (ref 0–149)
VLDL Cholesterol Cal: 10 mg/dL (ref 5–40)

## 2020-05-26 LAB — CBC
Hematocrit: 37.4 % (ref 34.0–46.6)
Hemoglobin: 13 g/dL (ref 11.1–15.9)
MCH: 32.2 pg (ref 26.6–33.0)
MCHC: 34.8 g/dL (ref 31.5–35.7)
MCV: 93 fL (ref 79–97)
Platelets: 242 10*3/uL (ref 150–450)
RBC: 4.04 x10E6/uL (ref 3.77–5.28)
RDW: 11.8 % (ref 11.7–15.4)
WBC: 3.2 10*3/uL — ABNORMAL LOW (ref 3.4–10.8)

## 2020-05-26 LAB — VITAMIN D 25 HYDROXY (VIT D DEFICIENCY, FRACTURES): Vit D, 25-Hydroxy: 38.6 ng/mL (ref 30.0–100.0)

## 2020-05-26 LAB — FERRITIN: Ferritin: 56 ng/mL (ref 15–150)

## 2020-05-26 LAB — TSH: TSH: 2 u[IU]/mL (ref 0.450–4.500)

## 2020-05-29 ENCOUNTER — Encounter: Payer: Self-pay | Admitting: Obstetrics & Gynecology

## 2020-08-09 ENCOUNTER — Ambulatory Visit
Admission: EM | Admit: 2020-08-09 | Discharge: 2020-08-09 | Disposition: A | Discharge status: Home / Self Care | Payer: 59 | Attending: Sports Medicine | Admitting: Sports Medicine

## 2020-08-09 ENCOUNTER — Other Ambulatory Visit: Payer: Self-pay

## 2020-08-09 DIAGNOSIS — N390 Urinary tract infection, site not specified: Secondary | ICD-10-CM | POA: Diagnosis not present

## 2020-08-09 LAB — URINALYSIS, COMPLETE (UACMP) WITH MICROSCOPIC
Bilirubin Urine: NEGATIVE
Glucose, UA: NEGATIVE mg/dL
Ketones, ur: NEGATIVE mg/dL
Nitrite: NEGATIVE
Protein, ur: NEGATIVE mg/dL
Specific Gravity, Urine: 1.015 (ref 1.005–1.030)
Squamous Epithelial / HPF: NONE SEEN (ref 0–5)
WBC, UA: >50 WBC/hpf (ref 0–5)
pH: 7 (ref 5.0–8.0)

## 2020-08-09 MED ORDER — SULFAMETHOXAZOLE-TRIMETHOPRIM 800-160 MG PO TABS: 1.0000 | ORAL_TABLET | Freq: Two times a day (BID) | ORAL | 0 refills | Status: AC

## 2020-08-09 MED ORDER — PHENAZOPYRIDINE HCL 200 MG PO TABS: 200.0000 mg | ORAL_TABLET | Freq: Three times a day (TID) | ORAL | 0 refills | Status: DC

## 2020-08-09 NOTE — ED Provider Notes (Signed)
[facility]    CSN: [id number] Arrival date & time: 08/09/20  1701      History   Chief Complaint Chief Complaint  Patient presents with  . Urinary Frequency    HPI Susan West is a 50 y.o. female.   HPI   50 year old female here for evaluation of urinary urgency, frequency, and pain with urination that has been going on for the past 5 days.  Patient reports that her urine has been cloudy off and on she has had a lot of suprapubic pressure.  Patient denies burning with urination but states that there is pain towards the middle and end of voiding.  She describes the pain is pretty intense and then they are feeling of a short-lived internal itching sensation.  Patient denies vaginal itching or discharge, fever, blood in her urine, low back pain, nausea or vomiting.  Past Medical History:  Diagnosis Date  . Acne    adult-on doxycycline daily  . ADD (attention deficit disorder)   . Allergy   . Asthma    EXERCISE INDUCED  . BRCA1 positive 01/2017  . Depression 2006   and anxiety  . Environmental allergies   . Headache, migraine   . history of abnormal Pap smear       . Osteopenia 2006  . PONV (postoperative nausea and vomiting)   . Seasonal allergies     Patient Active Problem List   Diagnosis Date Noted  . Osteomyelitis ([place]) 04/08/2019  . BRCA1 positive 03/21/2017    Past Surgical History:  Procedure Laterality Date  . AUGMENTATION MAMMAPLASTY Bilateral 01/2001  . BREAST ENHANCEMENT SURGERY  2002   bilateral implants  . CESAREAN SECTION  06/2000  . COSMETIC SURGERY    . LAPAROSCOPIC HYSTERECTOMY  03/06/2018   wtih LSO  . LAPAROTOMY  04/21/2012   Procedure: EXPLORATORY LAPAROTOMY;  Surgeon: Janie Morning, MD PHD;  Location: [facility];  Service: Gynecology;  Laterality: N/A;  . NASAL SINUS SURGERY     1999  . SALPINGOOPHORECTOMY  04/21/2012   Procedure: SALPINGO OOPHERECTOMY;  Surgeon: Janie Morning, MD PHD;  Location: [facility];  Service:  Gynecology;  Laterality: Right;    OB History    Gravida  3   Para  2   Term      Preterm      AB  1   Living  2     SAB      IAB  1   Ectopic      Multiple      Live Births               Home Medications    Prior to Admission medications   Medication Sig Start Date End Date Taking? Authorizing Provider  cetirizine (ZYRTEC) 10 MG tablet Take 10 mg by mouth daily.   Yes [provider]  Multiple Vitamins-Minerals (HAIR SKIN AND NAILS FORMULA PO) Take by mouth.   Yes [provider]  Nutritional Supplements (JUICE PLUS FIBRE PO) Take by mouth.   Yes [provider]  phenazopyridine (PYRIDIUM) 200 MG tablet Take 1 tablet (200 mg total) by mouth 3 (three) times daily. 08/09/20  Yes Margarette Canada, NP  rizatriptan (MAXALT) 10 MG tablet Take 1 tablet (10 mg total) by mouth as needed for migraine. May repeat in 2 hours if needed 04/14/20  Yes [name], MD  sulfamethoxazole-trimethoprim (BACTRIM DS) 800-160 MG tablet Take 1 tablet by mouth 2 (two) times daily for 7 days.  08/09/20 08/16/20 Yes Margarette Canada, NP  azelastine (ASTELIN) 0.1 % nasal spray Place into both nostrils 2 (two) times daily. Use in each nostril as directed  02/23/19  [provider]  levonorgestrel (MIRENA) 20 MCG/24HR IUD 1 each by Intrauterine route once.  02/23/19  [provider]    Family History Family History  Problem Relation Age of Onset  . Cancer Father        prostate, bone  . Hypertension Maternal Grandmother   . Hypertension Paternal Grandfather   . Heart attack Paternal Grandfather   . Hypertension Paternal Grandmother   . Hyperlipidemia Sister   . Breast cancer Neg Hx     Social History Social History   Tobacco Use  . Smoking status: Never Smoker  . Smokeless tobacco: Never Used  Vaping Use  . Vaping Use: Never used  Substance Use Topics  . Alcohol use: Yes    Alcohol/week: 0.0 - 1.0 standard drinks  . Drug use: Not Currently      Allergies   Penicillins, Azithromycin, Doxycycline, Loratadine, Tdap [tetanus-diphth-acell pertussis], Ultracet [tramadol-acetaminophen], and Gold   Review of Systems Review of Systems  Constitutional: Negative for fever.  Gastrointestinal: Negative for abdominal pain, diarrhea, nausea and vomiting.  Genitourinary: Positive for dysuria, frequency and urgency. Negative for hematuria, vaginal bleeding and vaginal discharge.  Musculoskeletal: Negative for back pain.  Skin: Negative for rash.  Hematological: Negative.   Psychiatric/Behavioral: Negative.      Physical Exam Triage Vital Signs ED Triage Vitals  Enc Vitals Group     BP 08/09/20 1808 113/74     Pulse Rate 08/09/20 1808 67     Resp 08/09/20 1808 18     Temp 08/09/20 1808 97.7 F (36.5 C)     Temp Source 08/09/20 1808 Oral     SpO2 08/09/20 1808 100 %     Weight 08/09/20 1806 129 lb (58.5 kg)     Height 08/09/20 1806 5' 2.75" (1.594 m)     Head Circumference --      Peak Flow --      Pain Score 08/09/20 1806 6     Pain Loc --      Pain Edu? --      Excl. in [place]? --    No data found.  Updated Vital Signs BP 113/74 (BP Location: Right Arm)   Pulse 67   Temp 97.7 F (36.5 C) (Oral)   Resp 18   Ht 5' 2.75" (1.594 m)   Wt 129 lb (58.5 kg)   LMP 12/08/2017 (Within Days)   SpO2 100%   BMI 23.03 kg/m   Visual Acuity Right Eye Distance:   Left Eye Distance:   Bilateral Distance:    Right Eye Near:   Left Eye Near:    Bilateral Near:     Physical Exam Vitals and nursing note reviewed.  Constitutional:      General: She is not in acute distress.    Appearance: Normal appearance. She is normal weight. She is not toxic-appearing.  HENT:     Head: Normocephalic and atraumatic.  Cardiovascular:     Rate and Rhythm: Normal rate and regular rhythm.     Pulses: Normal pulses.     Heart sounds: Normal heart sounds. No murmur heard. No gallop.   Pulmonary:     Effort: Pulmonary effort is normal.      Breath sounds: Normal breath sounds. No wheezing, rhonchi or rales.  Abdominal:     Tenderness: There  is no right CVA tenderness or left CVA tenderness.  Skin:    General: Skin is warm and dry.     Capillary Refill: Capillary refill takes less than 2 seconds.     Findings: No erythema or rash.  Neurological:     General: No focal deficit present.     Mental Status: She is alert and oriented to person, place, and time.  Psychiatric:        Mood and Affect: Mood normal.        Behavior: Behavior normal.        Thought Content: Thought content normal.        Judgment: Judgment normal.      UC Treatments / Results  Labs (all labs ordered are listed, but only abnormal results are displayed) Labs Reviewed  URINALYSIS, COMPLETE (UACMP) WITH MICROSCOPIC - Abnormal; Notable for the following components:      Result Value   APPearance HAZY (*)    Hgb urine dipstick TRACE (*)    Leukocytes,Ua TRACE (*)    Non Squamous Epithelial PRESENT (*)    Bacteria, UA FEW (*)    All other components within normal limits  URINE CULTURE    EKG   Radiology No results found.  Procedures Procedures (including critical care time)  Medications Ordered in UC Medications - No data to display  Initial Impression / Assessment and Plan / UC Course  I have reviewed the triage vital signs and the nursing notes.  Pertinent labs & imaging results that were available during my care of the patient were reviewed by me and considered in my medical decision making (see chart for details).   Patient is here for evaluation of UTI symptoms have been going on for the past 5 days.  Patient is nontoxic-appearing, very pleasant 50 year old female who has had urinary urgency, frequency and pain with urination for 5 days.  Physical exam reveals no CVA tenderness, lung sounds clear to auscultation all fields, heart sounds S1-S2, and a benign abdomen. UA collected at triage.  UA reveals trace hemoglobin, trace  leukocytes, non-squamous epithelials, greater than 50 WBCs, few bacteria, and white blood cells present in clumps.  We will send urine for culture.  We will treat with Bactrim twice daily for 7 days as patient is allergic to penicillin, azithromycin, and doxycycline.   Final Clinical Impressions(s) / UC Diagnoses   Final diagnoses:  Lower urinary tract infectious disease     Discharge Instructions     From twice daily for 7 days for treatment of urinary tract infection.  Take with a full glass of water.  Take the Pyridium every 8 hours as needed for urinary discomfort.  Continue to increase your oral fluid intake so that you increase your urine production and flush your urinary system.  We will culture your urine and change the antibiotic if we need to.  If you develop any fever, abdominal pain, or nausea and vomiting and cannot keep down medications return for reevaluation.    ED Prescriptions    Medication Sig Dispense Auth. Provider   sulfamethoxazole-trimethoprim (BACTRIM DS) 800-160 MG tablet Take 1 tablet by mouth 2 (two) times daily for 7 days. 14 tablet Margarette Canada, NP   phenazopyridine (PYRIDIUM) 200 MG tablet Take 1 tablet (200 mg total) by mouth 3 (three) times daily. 6 tablet Margarette Canada, NP     PDMP not reviewed this encounter.   Margarette Canada, NP 08/09/20 1835

## 2020-08-09 NOTE — ED Triage Notes (Signed)
Patient states that she has noticed a foul smelling urine and cloudy. Patient states that she has been having urinary urgency, frequency and burning x 5 days. States that she has never had a UTI before but feels like her symptoms are consistent with one.

## 2020-08-09 NOTE — Discharge Instructions (Signed)
From twice daily for 7 days for treatment of urinary tract infection.  Take with a full glass of water.  Take the Pyridium every 8 hours as needed for urinary discomfort.  Continue to increase your oral fluid intake so that you increase your urine production and flush your urinary system.  We will culture your urine and change the antibiotic if we need to.  If you develop any fever, abdominal pain, or nausea and vomiting and cannot keep down medications return for reevaluation.

## 2020-08-11 LAB — URINE CULTURE
Culture: <10000 — AB
Special Requests: NORMAL

## 2020-09-21 DIAGNOSIS — J4520 Mild intermittent asthma, uncomplicated: Secondary | ICD-10-CM | POA: Insufficient documentation

## 2020-09-22 ENCOUNTER — Encounter (HOSPITAL_BASED_OUTPATIENT_CLINIC_OR_DEPARTMENT_OTHER): Payer: Self-pay

## 2020-09-26 ENCOUNTER — Telehealth: Payer: Self-pay | Admitting: *Deleted

## 2020-09-26 ENCOUNTER — Other Ambulatory Visit (HOSPITAL_BASED_OUTPATIENT_CLINIC_OR_DEPARTMENT_OTHER): Payer: Self-pay | Admitting: Obstetrics & Gynecology

## 2020-09-26 DIAGNOSIS — D709 Neutropenia, unspecified: Secondary | ICD-10-CM

## 2020-09-26 NOTE — Telephone Encounter (Signed)
Per referral Dr. Sabra Heck 09/26/20 - lvm of upcoming appts - mailed welcome packet with calendar

## 2020-10-27 ENCOUNTER — Other Ambulatory Visit: Payer: 59

## 2020-10-27 ENCOUNTER — Ambulatory Visit: Payer: 59 | Admitting: Family

## 2020-10-30 ENCOUNTER — Ambulatory Visit: Payer: 59 | Admitting: Family

## 2020-10-30 ENCOUNTER — Other Ambulatory Visit: Payer: 59

## 2020-11-21 ENCOUNTER — Encounter: Payer: Self-pay | Admitting: Family

## 2020-11-22 ENCOUNTER — Other Ambulatory Visit: Payer: Self-pay | Admitting: Family

## 2020-11-22 DIAGNOSIS — D709 Neutropenia, unspecified: Secondary | ICD-10-CM

## 2020-11-24 ENCOUNTER — Other Ambulatory Visit: Payer: Self-pay

## 2020-11-24 ENCOUNTER — Inpatient Hospital Stay (HOSPITAL_BASED_OUTPATIENT_CLINIC_OR_DEPARTMENT_OTHER): Payer: 59 | Admitting: Family

## 2020-11-24 ENCOUNTER — Inpatient Hospital Stay: Payer: 59 | Attending: Hematology & Oncology

## 2020-11-24 ENCOUNTER — Encounter: Payer: Self-pay | Admitting: Family

## 2020-11-24 VITALS — BP 103/68 | HR 75 | Resp 17 | Ht 62.75 in | Wt 130.8 lb

## 2020-11-24 DIAGNOSIS — Z1509 Genetic susceptibility to other malignant neoplasm: Secondary | ICD-10-CM | POA: Diagnosis not present

## 2020-11-24 DIAGNOSIS — D709 Neutropenia, unspecified: Secondary | ICD-10-CM

## 2020-11-24 DIAGNOSIS — M8580 Other specified disorders of bone density and structure, unspecified site: Secondary | ICD-10-CM | POA: Diagnosis not present

## 2020-11-24 DIAGNOSIS — Z1501 Genetic susceptibility to malignant neoplasm of breast: Secondary | ICD-10-CM | POA: Diagnosis not present

## 2020-11-24 DIAGNOSIS — R5383 Other fatigue: Secondary | ICD-10-CM | POA: Diagnosis not present

## 2020-11-24 LAB — CBC WITH DIFFERENTIAL (CANCER CENTER ONLY)
Abs Immature Granulocytes: 0.01 10*3/uL (ref 0.00–0.07)
Basophils Absolute: 0 10*3/uL (ref 0.0–0.1)
Basophils Relative: 0 %
Eosinophils Absolute: 0 10*3/uL (ref 0.0–0.5)
Eosinophils Relative: 1 %
HCT: 38.8 % (ref 36.0–46.0)
Hemoglobin: 13.1 g/dL (ref 12.0–15.0)
Immature Granulocytes: 0 %
Lymphocytes Relative: 31 %
Lymphs Abs: 1.4 10*3/uL (ref 0.7–4.0)
MCH: 31.6 pg (ref 26.0–34.0)
MCHC: 33.8 g/dL (ref 30.0–36.0)
MCV: 93.7 fL (ref 80.0–100.0)
Monocytes Absolute: 0.4 10*3/uL (ref 0.1–1.0)
Monocytes Relative: 9 %
Neutro Abs: 2.6 10*3/uL (ref 1.7–7.7)
Neutrophils Relative %: 59 %
Platelet Count: 242 10*3/uL (ref 150–400)
RBC: 4.14 MIL/uL (ref 3.87–5.11)
RDW: 11.8 % (ref 11.5–15.5)
WBC Count: 4.5 10*3/uL (ref 4.0–10.5)
nRBC: 0 % (ref 0.0–0.2)

## 2020-11-24 LAB — CMP (CANCER CENTER ONLY)
ALT: 13 U/L (ref 0–44)
AST: 15 U/L (ref 15–41)
Albumin: 4.2 g/dL (ref 3.5–5.0)
Alkaline Phosphatase: 52 U/L (ref 38–126)
Anion gap: 5 (ref 5–15)
BUN: 12 mg/dL (ref 6–20)
CO2: 30 mmol/L (ref 22–32)
Calcium: 9.5 mg/dL (ref 8.9–10.3)
Chloride: 103 mmol/L (ref 98–111)
Creatinine: 0.77 mg/dL (ref 0.44–1.00)
GFR, Estimated: >60 mL/min (ref >60)
Glucose, Bld: 101 mg/dL — ABNORMAL HIGH (ref 70–99)
Potassium: 4.3 mmol/L (ref 3.5–5.1)
Sodium: 138 mmol/L (ref 135–145)
Total Bilirubin: 0.5 mg/dL (ref 0.3–1.2)
Total Protein: 6.7 g/dL (ref 6.5–8.1)

## 2020-11-24 LAB — LACTATE DEHYDROGENASE: LDH: 145 U/L (ref 98–192)

## 2020-11-24 LAB — SAVE SMEAR(SSMR), FOR PROVIDER SLIDE REVIEW

## 2020-11-24 NOTE — Progress Notes (Signed)
Hematology/Oncology Consultation   Name: Susan West      MRN: [id number]    Location: Room/bed info not found  Date: 11/24/2020 Time:1:47 PM   REFERRING PHYSICIAN: [name], MD  REASON FOR CONSULT: Neutropenia    DIAGNOSIS: Mild Neutropenia   HISTORY OF PRESENT ILLNESS: Susan West is a very pleasant 50 yo female with mild neutropenia noted over the last 6 months. WBC count was 3.2 in November 2021 and again in March 2022.  WBC count today is back up to 4.5, neutrophils 59% and leukocytes 31%.  Hgb 13.1,  MCV 93 and platelets 242.  Susan West notes fatigue and states that her job has been extremely stressful. Susan West has left this job. Susan West interviewed and accepted a new position somewhere else today and is very excited.  Susan West has had no issue with frequent infection. No fever, chills, n/v, cough, dizziness, SOB, chest pain, palpitations, abdominal pain or changes in bowel or bladder habits.  Susan West has occasional numbness and tingling in her hands which Susan West feels is positional associated with her mattress. No falls or syncope to report. No blood loss noted. No abnormal bruising or petechiae.   Susan West did get sunburned on her shoulders and back last week and states that Susan West got a little sun poisoning with rash that is healing.  Her father has history of metastatic prostate cancer to the bones that they recently found has spread to his bladder. He is waiting to follow-up with [facility] to try a new medication.  Both Susan West and her father have the BRCA1 gene mutation and Susan West goes for regular breat MRI's. No personal history of cancer.  Susan West had a total hysterectomy in 2019.  Susan West was in PT to help strengthen her pelvic floor. Susan West states that sometimes Susan West feels a "pull" in her right or left groin. This comes and goes.  Susan West has 2 adult children and no history of miscarriage.   No history of diabetes or thyroid disease.  No smoking or recreational drugs.  Susan West has the occasional alcoholic beverage socially. Susan West  has maintained a good appetite and is staying well hydrated. Her weight is stable.  Susan West works out at least twice a week at [employer].  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   ROS: All other 10 point review of systems is negative.   PAST MEDICAL HISTORY:   Past Medical History:  Diagnosis Date  . Acne    adult-on doxycycline daily  . ADD (attention deficit disorder)   . Allergy   . Asthma    EXERCISE INDUCED  . BRCA1 positive 01/2017  . Depression 2006   and anxiety  . Environmental allergies   . Headache, migraine   . history of abnormal Pap smear       . Osteopenia 2006  . PONV (postoperative nausea and vomiting)   . Seasonal allergies     ALLERGIES: Allergies  Allergen Reactions  . Penicillins Nausea Only    Extreme stomach pain Extreme stomach pain  . Azithromycin Nausea Only    Other Reactions Per Pt: Back Pain, Menstrual Spotting, Abdominal Cramping  . Doxycycline Other (See Comments)    Yeast infection  Yeast infection   . Loratadine Other (See Comments)    migraines migraines  . Tdap [Tetanus-Diphth-Acell Pertussis]     Significant swelling x one month  . Ultracet [Tramadol-Acetaminophen] Nausea And Vomiting  . Gold Rash      MEDICATIONS:  Current Outpatient Medications on  File Prior to Visit  Medication Sig Dispense Refill  . cetirizine (ZYRTEC) 10 MG tablet Take 10 mg by mouth daily.    . Multiple Vitamins-Minerals (HAIR SKIN AND NAILS FORMULA PO) Take by mouth.    . Nutritional Supplements (JUICE PLUS FIBRE PO) Take by mouth.    . phenazopyridine (PYRIDIUM) 200 MG tablet Take 1 tablet (200 mg total) by mouth 3 (three) times daily. 6 tablet 0  . rizatriptan (MAXALT) 10 MG tablet Take 1 tablet (10 mg total) by mouth as needed for migraine. May repeat in 2 hours if needed 10 tablet 12  . [DISCONTINUED] azelastine (ASTELIN) 0.1 % nasal spray Place into both nostrils 2 (two) times daily. Use in each nostril as directed    . [DISCONTINUED]  levonorgestrel (MIRENA) 20 MCG/24HR IUD 1 each by Intrauterine route once.     No current facility-administered medications on file prior to visit.     PAST SURGICAL HISTORY Past Surgical History:  Procedure Laterality Date  . AUGMENTATION MAMMAPLASTY Bilateral 01/2001  . BREAST ENHANCEMENT SURGERY  2002   bilateral implants  . CESAREAN SECTION  06/2000  . COSMETIC SURGERY    . LAPAROSCOPIC HYSTERECTOMY  03/06/2018   wtih LSO  . LAPAROTOMY  04/21/2012   Procedure: EXPLORATORY LAPAROTOMY;  Surgeon: Janie Morning, MD PHD;  Location: [facility];  Service: Gynecology;  Laterality: N/A;  . NASAL SINUS SURGERY     1999  . SALPINGOOPHORECTOMY  04/21/2012   Procedure: SALPINGO OOPHERECTOMY;  Surgeon: Janie Morning, MD PHD;  Location: [facility];  Service: Gynecology;  Laterality: Right;    FAMILY HISTORY: Family History  Problem Relation Age of Onset  . Cancer Father        prostate, bone  . Hypertension Maternal Grandmother   . Hypertension Paternal Grandfather   . Heart attack Paternal Grandfather   . Hypertension Paternal Grandmother   . Hyperlipidemia Sister   . Breast cancer Neg Hx     SOCIAL HISTORY:  reports that Susan West has never smoked. Susan West has never used smokeless tobacco. Susan West reports current alcohol use. Susan West reports previous drug use.  PERFORMANCE STATUS: The patient's performance status is 0 - Asymptomatic  PHYSICAL EXAM: Most Recent Vital Signs: Last menstrual period 12/08/2017. BP 103/68 (BP Location: Right Arm, Patient Position: Sitting)   Pulse 75   Resp 17   Ht 5' 2.75" (1.594 m)   Wt 130 lb 12.8 oz (59.3 kg)   LMP 12/08/2017 (Within Days)   SpO2 100%   BMI 23.36 kg/m   General Appearance:    Alert, cooperative, no distress, appears stated age  Head:    Normocephalic, without obvious abnormality, atraumatic  Eyes:    PERRL, conjunctiva/corneas clear, EOM's intact, fundi    benign, both eyes        Throat:   Lips, mucosa, and tongue normal; teeth and gums normal   Neck:   Supple, symmetrical, trachea midline, no adenopathy;    thyroid:  no enlargement/tenderness/nodules; no carotid   bruit or JVD  Back:     Symmetric, no curvature, ROM normal, no CVA tenderness  Lungs:     Clear to auscultation bilaterally, respirations unlabored  Chest Wall:    No tenderness or deformity   Heart:    Regular rate and rhythm, S1 and S2 normal, no murmur, rub   or gallop     Abdomen:     Soft, non-tender, bowel sounds active all four quadrants,    no masses, no organomegaly  Extremities:   Extremities normal, atraumatic, no cyanosis or edema  Pulses:   2+ and symmetric all extremities  Skin:   Skin color, texture, turgor normal, no rashes or lesions  Lymph nodes:   Cervical, supraclavicular, and axillary nodes normal  Neurologic:   CNII-XII intact, normal strength, sensation and reflexes    throughout    LABORATORY DATA:  Results for orders placed or performed in visit on 11/24/20 (from the past 48 hour(s))  CBC with Differential ([place] Only)     Status: None   Collection Time: 11/24/20  1:19 PM  Result Value Ref Range   WBC Count 4.5 4.0 - 10.5 K/uL   RBC 4.14 3.87 - 5.11 MIL/uL   Hemoglobin 13.1 12.0 - 15.0 g/dL   HCT 38.8 36.0 - 46.0 %   MCV 93.7 80.0 - 100.0 fL   MCH 31.6 26.0 - 34.0 pg   MCHC 33.8 30.0 - 36.0 g/dL   RDW 11.8 11.5 - 15.5 %   Platelet Count 242 150 - 400 K/uL   nRBC 0.0 0.0 - 0.2 %   Neutrophils Relative % 59 %   Neutro Abs 2.6 1.7 - 7.7 K/uL   Lymphocytes Relative 31 %   Lymphs Abs 1.4 0.7 - 4.0 K/uL   Monocytes Relative 9 %   Monocytes Absolute 0.4 0.1 - 1.0 K/uL   Eosinophils Relative 1 %   Eosinophils Absolute 0.0 0.0 - 0.5 K/uL   Basophils Relative 0 %   Basophils Absolute 0.0 0.0 - 0.1 K/uL   Immature Granulocytes 0 %   Abs Immature Granulocytes 0.01 0.00 - 0.07 K/uL    Comment: Performed at [facility] at [facility], [address] [place], [place] [address]  Save Smear  [facility])     Status: None   Collection Time: 11/24/20  1:19 PM  Result Value Ref Range   Smear Review SMEAR STAINED AND AVAILABLE FOR REVIEW     Comment: Performed at [facility] at [facility], [address] [place], [place] [address]      RADIOGRAPHY: No results found.     PATHOLOGY: None  ASSESSMENT/PLAN: Ms. Haeberle is a very pleasant 50 yo female with mild neutropenia noted over the last 6 months. Susan West had been under a lot of stress in the last year with her job.  Her counts at this time have resolved and are within normal limits.  Blood smear reviewed with Dr. Marin Olp and no evidence of abnormality or malignancy noted.  At this point we will release her back to Dr. Sabra Heck.  All questions were answered. The patient knows to call the clinic with any problems, questions or concerns. We can certainly see her again in the future if needed.   The patient was discussed with Dr. Marin Olp and he is in agreement with the aforementioned.   Laverna Peace, NP

## 2020-11-27 ENCOUNTER — Telehealth: Payer: Self-pay | Admitting: *Deleted

## 2020-11-27 NOTE — Telephone Encounter (Signed)
Per 11/24/20 los  - no follow up needed. Released back to referring MD.

## 2020-12-20 ENCOUNTER — Other Ambulatory Visit (HOSPITAL_BASED_OUTPATIENT_CLINIC_OR_DEPARTMENT_OTHER): Payer: Self-pay | Admitting: Obstetrics & Gynecology

## 2020-12-20 DIAGNOSIS — G43709 Chronic migraine without aura, not intractable, without status migrainosus: Secondary | ICD-10-CM

## 2020-12-21 NOTE — Telephone Encounter (Signed)
LMOVM. Called to confirm if refill was actually needed since 12 refills had been sent in 03/2020

## 2021-01-25 ENCOUNTER — Emergency Department
Admission: EM | Admit: 2021-01-25 | Discharge: 2021-01-25 | Disposition: A | Discharge status: Home / Self Care | Payer: 59 | Attending: Emergency Medicine | Admitting: Emergency Medicine

## 2021-01-25 ENCOUNTER — Emergency Department: Payer: 59

## 2021-01-25 ENCOUNTER — Other Ambulatory Visit: Payer: Self-pay

## 2021-01-25 DIAGNOSIS — U071 COVID-19: Secondary | ICD-10-CM | POA: Insufficient documentation

## 2021-01-25 DIAGNOSIS — J45909 Unspecified asthma, uncomplicated: Secondary | ICD-10-CM | POA: Insufficient documentation

## 2021-01-25 DIAGNOSIS — R0981 Nasal congestion: Secondary | ICD-10-CM | POA: Diagnosis present

## 2021-01-25 LAB — BASIC METABOLIC PANEL
Anion gap: 8 (ref 5–15)
BUN: 8 mg/dL (ref 6–20)
CO2: 28 mmol/L (ref 22–32)
Calcium: 8.9 mg/dL (ref 8.9–10.3)
Chloride: 103 mmol/L (ref 98–111)
Creatinine, Ser: 0.83 mg/dL (ref 0.44–1.00)
GFR, Estimated: >60 mL/min (ref >60)
Glucose, Bld: 99 mg/dL (ref 70–99)
Potassium: 3.7 mmol/L (ref 3.5–5.1)
Sodium: 139 mmol/L (ref 135–145)

## 2021-01-25 LAB — CBC WITH DIFFERENTIAL/PLATELET
Abs Immature Granulocytes: 0 10*3/uL (ref 0.00–0.07)
Basophils Absolute: 0 10*3/uL (ref 0.0–0.1)
Basophils Relative: 0 %
Eosinophils Absolute: 0 10*3/uL (ref 0.0–0.5)
Eosinophils Relative: 0 %
HCT: 40.3 % (ref 36.0–46.0)
Hemoglobin: 13.5 g/dL (ref 12.0–15.0)
Immature Granulocytes: 0 %
Lymphocytes Relative: 20 %
Lymphs Abs: 0.5 10*3/uL — ABNORMAL LOW (ref 0.7–4.0)
MCH: 31.5 pg (ref 26.0–34.0)
MCHC: 33.5 g/dL (ref 30.0–36.0)
MCV: 93.9 fL (ref 80.0–100.0)
Monocytes Absolute: 0.4 10*3/uL (ref 0.1–1.0)
Monocytes Relative: 15 %
Neutro Abs: 1.7 10*3/uL (ref 1.7–7.7)
Neutrophils Relative %: 65 %
Platelets: 160 10*3/uL (ref 150–400)
RBC: 4.29 MIL/uL (ref 3.87–5.11)
RDW: 12 % (ref 11.5–15.5)
WBC: 2.6 10*3/uL — ABNORMAL LOW (ref 4.0–10.5)
nRBC: 0 % (ref 0.0–0.2)

## 2021-01-25 MED ORDER — DEXAMETHASONE SODIUM PHOSPHATE 10 MG/ML IJ SOLN
10.0000 mg | Freq: Once | INTRAMUSCULAR | Status: AC
  Administered 2021-01-25: 10 mg via INTRAVENOUS
  Filled 2021-01-25: qty 1

## 2021-01-25 MED ORDER — NIRMATRELVIR/RITONAVIR (PAXLOVID)TABLET: 3.0000 | ORAL_TABLET | Freq: Two times a day (BID) | ORAL | 0 refills | Status: AC

## 2021-01-25 MED ORDER — METHYLPREDNISOLONE 4 MG PO TBPK: ORAL_TABLET | ORAL | 0 refills | Status: DC

## 2021-01-25 MED ORDER — NIRMATRELVIR/RITONAVIR (PAXLOVID)TABLET: 3.0000 | ORAL_TABLET | Freq: Two times a day (BID) | ORAL | 0 refills | Status: DC

## 2021-01-25 NOTE — ED Provider Notes (Signed)
[facility] Emergency Department Provider Note  ____________________________________________   Event Date/Time   First MD Initiated Contact with Patient 01/25/21 1332     (approximate)  I have reviewed the triage vital signs and the nursing notes.   HISTORY  Chief Complaint Covid Positive    HPI LEXANY BELKNAP is a 50 y.o. female presents emergency department with COVID-like symptoms.  Patient states that she had a positive COVID test at home.  Patient started having runny nose congestion and facial pain on _0 99   SALPINGOOPHORECTOMY  04/21/2012   Procedure: SALPINGO OOPHERECTOMY;  Surgeon: Janie Morning, MD  PHD;  Location: [facility];  Service: Gynecology;  Laterality: Right;    Prior to Admission medications   Medication Sig Start Date End Date Taking? Authorizing Provider  methylPREDNISolone (MEDROL DOSEPAK) 4 MG TBPK tablet Take 6 pills on day one then decrease by 1 pill each day 01/25/21  Yes Khalid Lacko, Linden Dolin, PA-C  cetirizine (ZYRTEC) 10 MG tablet Take 10 mg by mouth daily.    [provider]  Multiple Vitamins-Minerals (HAIR SKIN AND NAILS FORMULA PO) Take by mouth.    [provider]  nirmatrelvir/ritonavir EUA (PAXLOVID) TABS Take 3 tablets by mouth 2 (two) times daily for 5 days. Patient GFR is greater than 60 Take nirmatrelvir (150 mg) two tablets twice daily for 5 days and ritonavir (100 mg) one tablet twice daily for 5 days. 01/25/21 01/30/21  Caryn Section Linden Dolin, PA-C  Nutritional Supplements (JUICE PLUS FIBRE PO) Take by mouth.    [provider]  rizatriptan (MAXALT) 10 MG tablet TAKE 1 TABLET BY MOUTH AS NEEDED FOR MIGRAINE. MAY REPEAT IN 2 HOURS IF NEEDED. 12/22/20   [name], MD  azelastine (ASTELIN) 0.1 % nasal spray Place into both nostrils 2 (two) times daily. Use in each nostril as directed  02/23/19  [provider]  levonorgestrel (MIRENA) 20 MCG/24HR IUD 1 each by Intrauterine route once.  02/23/19  [provider]  Allergies Penicillins, Azithromycin, Doxycycline, Loratadine, Tdap [tetanus-diphth-acell pertussis], Ultracet [tramadol-acetaminophen], and Gold  Family History  Problem Relation Age of Onset   Cancer Father        prostate, bone   Hypertension Maternal Grandmother    Hypertension Paternal Grandfather    Heart attack Paternal Grandfather    Hypertension Paternal Grandmother    Hyperlipidemia Sister    Breast cancer Neg Hx     Social History Social History   Tobacco Use   Smoking status: Never   Smokeless tobacco: Never  Vaping Use   Vaping Use: Never used  Substance Use Topics   Alcohol use: Yes     Alcohol/week: 0.0 - 1.0 standard drinks   Drug use: Not Currently    Review of Systems  Constitutional: No fever/chills Eyes: No visual changes. ENT: No sore throat. Respiratory: Positive cough Cardiovascular: Denies chest pain Gastrointestinal: Denies abdominal pain Genitourinary: Negative for dysuria. Musculoskeletal: Negative for back pain. Skin: Negative for rash. Psychiatric: no mood changes,     ____________________________________________   PHYSICAL EXAM:  VITAL SIGNS: ED Triage Vitals  Enc Vitals Group     BP 01/25/21 1255 (!) 141/84     Pulse Rate 01/25/21 1255 96     Resp 01/25/21 1255 18     Temp 01/25/21 1255 (!) 101.6 F (38.7 C)     Temp Source 01/25/21 1255 Oral     SpO2 01/25/21 1255 100 %     Weight 01/25/21 1254 132 lb (59.9 kg)     Height 01/25/21 1254 5' 2.3" (1.582 m)     Head Circumference --      Peak Flow --      Pain Score --      Pain Loc --      Pain Edu? --      Excl. in [place]? --     Constitutional: Alert and oriented. Well appearing and in no acute distress. Eyes: Conjunctivae are normal.  Head: Atraumatic. Nose: No congestion/rhinnorhea. Mouth/Throat: Mucous membranes are moist.   Neck:  supple no lymphadenopathy noted Cardiovascular: Normal rate, regular rhythm. Heart sounds are normal Respiratory: Normal respiratory effort.  No retractions, lungs c t a  GU: deferred Musculoskeletal: FROM all extremities, warm and well perfused Neurologic:  Normal speech and language.  Skin:  Skin is warm, dry and intact. No rash noted. Psychiatric: Mood and affect are normal. Speech and behavior are normal.  ____________________________________________   LABS (all labs ordered are listed, but only abnormal results are displayed)  Labs Reviewed  CBC WITH DIFFERENTIAL/PLATELET - Abnormal; Notable for the following components:      Result Value   WBC 2.6 (*)    Lymphs Abs 0.5 (*)    All other components within normal limits  SARS  CORONAVIRUS 2 (TAT 6-24 HRS)  BASIC METABOLIC PANEL   ____________________________________________   ____________________________________________  RADIOLOGY  Chest x-ray  ____________________________________________   PROCEDURES  Procedure(s) performed: No  Procedures    ____________________________________________   INITIAL IMPRESSION / ASSESSMENT AND PLAN / ED COURSE  Pertinent labs & imaging results that were available during my care of the patient were reviewed by me and considered in my medical decision making (see chart for details).   The patient is a 50 year old female presents with COVID-like symptoms.  See HPI.  Physical exam shows patient appears stable.  Chest x-ray reviewed by me confirmed by radiology to be negative for COVID-pneumonia.  Did explain the findings to the patient.  She is interested  in taking Paxil a bit.  We will obtain a COVID test and kidney functions.  At same time we will be able to give her Decadron through her IV.  CBC and metabolic panel are normal.  COVID test still pending  Since the patient has a positive COVID test I will still treat her with Paxil COVID and a Medrol Dosepak.  She is to follow-up with her regular doctor if not improving in 3 days.  Return emergency department worsening.  Since she is vaccinated she will not need to quarantine for more than 5 days.  She states she understands.  She discharged stable condition.  JOHN VASCONCELOS was evaluated in Emergency Department on 01/25/2021 for the symptoms described in the history of present illness. She was evaluated in the context of the global COVID-19 pandemic, which necessitated consideration that the patient might be at risk for infection with the SARS-CoV-2 virus that causes COVID-19. Institutional protocols and algorithms that pertain to the evaluation of patients at risk for COVID-19 are in a state of rapid change based on information released by regulatory bodies including  the CDC and federal and state organizations. These policies and algorithms were followed during the patient's care in the ED.    As part of my medical decision making, I reviewed the following data within the [facility] notes reviewed and incorporated, Labs reviewed , Old chart reviewed, Radiograph reviewed , Notes from prior ED visits, and [place] Controlled Substance Database  ____________________________________________   FINAL CLINICAL IMPRESSION(S) / ED DIAGNOSES  Final diagnoses:  COVID-19      NEW MEDICATIONS STARTED DURING THIS VISIT:  New Prescriptions   METHYLPREDNISOLONE (MEDROL DOSEPAK) 4 MG TBPK TABLET    Take 6 pills on day one then decrease by 1 pill each day   NIRMATRELVIR/RITONAVIR EUA (PAXLOVID) TABS    Take 3 tablets by mouth 2 (two) times daily for 5 days. Patient GFR is greater than 60 Take nirmatrelvir (150 mg) two tablets twice daily for 5 days and ritonavir (100 mg) one tablet twice daily for 5 days.     Note:  This document was prepared using Dragon voice recognition software and may include unintentional dictation errors.    Versie Starks, PA-C 01/25/21 1556    Nance Pear, MD 01/26/21 1120

## 2021-01-25 NOTE — Discharge Instructions (Signed)

## 2021-01-25 NOTE — ED Triage Notes (Signed)
Pt has had a cough with congestion, body aches with fever for the past 3-4 days tested covid + states in the past 24hrs is feeling more SOB and was sent by PCP for further testing.pt is ambulatory with no distress noted with a steady gait

## 2021-01-26 LAB — SARS CORONAVIRUS 2 (TAT 6-24 HRS): SARS Coronavirus 2: POSITIVE — AB

## 2021-02-07 ENCOUNTER — Telehealth (HOSPITAL_BASED_OUTPATIENT_CLINIC_OR_DEPARTMENT_OTHER): Payer: Self-pay

## 2021-02-07 NOTE — Telephone Encounter (Signed)
Patient called and left a message wanting to know if Dr. Sabra Heck would call her in something for anxiety. She is just getting over Covid and has really had a hard time.   Spoke with Dr. Sabra Heck and she states patient needs to come in to be evaluated. tbw

## 2021-02-08 NOTE — Telephone Encounter (Signed)
Called patient and explain her that she needs be seen in order to get medication patient stated "never mind then ".

## 2021-02-09 ENCOUNTER — Encounter (HOSPITAL_BASED_OUTPATIENT_CLINIC_OR_DEPARTMENT_OTHER): Payer: Self-pay

## 2021-02-22 ENCOUNTER — Telehealth (HOSPITAL_BASED_OUTPATIENT_CLINIC_OR_DEPARTMENT_OTHER): Payer: Self-pay | Admitting: Obstetrics & Gynecology

## 2021-02-22 NOTE — Telephone Encounter (Signed)
LMOVM that annual exam was scheduled on 06/11/21. Advised to call back if she needs to change.

## 2021-02-22 NOTE — Telephone Encounter (Signed)
Called patient and a left message to call the office back and sent a mychart message as well.

## 2021-04-02 ENCOUNTER — Ambulatory Visit: Payer: 59 | Admitting: Dermatology

## 2021-04-02 ENCOUNTER — Other Ambulatory Visit: Payer: Self-pay

## 2021-04-02 DIAGNOSIS — D1800 Hemangioma unspecified site: Secondary | ICD-10-CM

## 2021-04-02 DIAGNOSIS — L814 Other melanin hyperpigmentation: Secondary | ICD-10-CM

## 2021-04-02 DIAGNOSIS — L821 Other seborrheic keratosis: Secondary | ICD-10-CM | POA: Diagnosis not present

## 2021-04-02 DIAGNOSIS — Z1283 Encounter for screening for malignant neoplasm of skin: Secondary | ICD-10-CM | POA: Diagnosis not present

## 2021-04-02 DIAGNOSIS — L820 Inflamed seborrheic keratosis: Secondary | ICD-10-CM

## 2021-04-02 DIAGNOSIS — L578 Other skin changes due to chronic exposure to nonionizing radiation: Secondary | ICD-10-CM

## 2021-04-02 DIAGNOSIS — D229 Melanocytic nevi, unspecified: Secondary | ICD-10-CM

## 2021-04-02 NOTE — Patient Instructions (Addendum)
Seborrheic Keratosis  What causes seborrheic keratoses? Seborrheic keratoses are harmless, common skin growths that first appear during adult life.  As time goes by, more growths appear.  Some people may develop a large number of them.  Seborrheic keratoses appear on both covered and uncovered body parts.  They are not caused by sunlight.  The tendency to develop seborrheic keratoses can be inherited.  They vary in color from skin-colored to gray, brown, or even black.  They can be either smooth or have a rough, warty surface.   Seborrheic keratoses are superficial and look as if they were stuck on the skin.  Under the microscope this type of keratosis looks like layers upon layers of skin.  That is why at times the top layer may seem to fall off, but the rest of the growth remains and re-grows.    Treatment Seborrheic keratoses do not need to be treated, but can easily be removed in the office.  Seborrheic keratoses often cause symptoms when they rub on clothing or jewelry.  Lesions can be in the way of shaving.  If they become inflamed, they can cause itching, soreness, or burning.  Removal of a seborrheic keratosis can be accomplished by freezing, burning, or surgery. If any spot bleeds, scabs, or grows rapidly, please return to have it checked, as these can be an indication of a skin cancer.  Cryotherapy Aftercare  Wash gently with soap and water everyday.   Apply Vaseline and Band-Aid daily until healed.    Melanoma ABCDEs  Melanoma is the most dangerous type of skin cancer, and is the leading cause of death from skin disease.  You are more likely to develop melanoma if you: Have light-colored skin, light-colored eyes, or red or blond hair Spend a lot of time in the sun Tan regularly, either outdoors or in a tanning bed Have had blistering sunburns, especially during childhood Have a close family member who has had a melanoma Have atypical moles or large birthmarks  Early detection of  melanoma is key since treatment is typically straightforward and cure rates are extremely high if we catch it early.   The first sign of melanoma is often a change in a mole or a new dark spot.  The ABCDE system is a way of remembering the signs of melanoma.  A for asymmetry:  The two halves do not match. B for border:  The edges of the growth are irregular. C for color:  A mixture of colors are present instead of an even brown color. D for diameter:  Melanomas are usually (but not always) greater than 55m - the size of a pencil eraser. E for evolution:  The spot keeps changing in size, shape, and color.  Please check your skin once per month between visits. You can use a small mirror in front and a large mirror behind you to keep an eye on the back side or your body.   If you see any new or changing lesions before your next follow-up, please call to schedule a visit.  Please continue daily skin protection including broad spectrum sunscreen SPF 30+ to sun-exposed areas, reapplying every 2 hours as needed when you're outdoors.   Staying in the shade or wearing long sleeves, sun glasses (UVA+UVB protection) and wide brim hats (4-inch brim around the entire circumference of the hat) are also recommended for sun protection.    If you have any questions or concerns for your doctor, please call our main line at  [phone] and press option 4 to reach your doctor's medical assistant. If no one answers, please leave a voicemail as directed and we will return your call as soon as possible. Messages left after 4 pm will be answered the following business day.   You may also send [place] a message via [employer]. We typically respond to MyChart messages within 1-2 business days.  For prescription refills, please ask your pharmacy to contact our office. Our fax number is [phone].  If you have an urgent issue when the clinic is closed that cannot wait until the next business day, you can page your doctor at  the number below.    Please note that while we do our best to be available for urgent issues outside of office hours, we are not available 24/7.   If you have an urgent issue and are unable to reach [place], you may choose to seek medical care at your doctor's office, retail clinic, urgent care center, or emergency room.  If you have a medical emergency, please immediately call 911 or go to the emergency department.  Pager Numbers  - Dr. Nehemiah Massed: [phone]  - Dr. Laurence Ferrari: [phone]  - Dr. Nicole Kindred: [phone]  In the event of inclement weather, please call our main line at [phone] for an update on the status of any delays or closures.  Dermatology Medication Tips: Please keep the boxes that topical medications come in in order to help keep track of the instructions about where and how to use these. Pharmacies typically print the medication instructions only on the boxes and not directly on the medication tubes.   If your medication is too expensive, please contact our office at [phone] option 4 or send [place] a message through [place].   We are unable to tell what your co-pay for medications will be in advance as this is different depending on your insurance coverage. However, we may be able to find a substitute medication at lower cost or fill out paperwork to get insurance to cover a needed medication.   If a prior authorization is required to get your medication covered by your insurance company, please allow [place] 1-2 business days to complete this process.  Drug prices often vary depending on where the prescription is filled and some pharmacies may offer cheaper prices.  The website www.goodrx.com contains coupons for medications through different pharmacies. The prices here do not account for what the cost may be with help from insurance (it may be cheaper with your insurance), but the website can give you the price if you did not use any insurance.  - You can print the  associated coupon and take it with your prescription to the pharmacy.  - You may also stop by our office during regular business hours and pick up a GoodRx coupon card.  - If you need your prescription sent electronically to a different pharmacy, notify our office through [facility] [place] or by phone at [phone] option 4.

## 2021-04-02 NOTE — Progress Notes (Signed)
   Follow-Up Visit   Subjective  Susan West is a 50 y.o. female who presents for the following: Annual Exam (Patient here today for total body exam. Patient reports a few spots at arms, right clavicle, and lower legs. She denies other concerns today. ). Patient here for full body skin exam and skin cancer screening.  The following portions of the chart were reviewed this encounter and updated as appropriate:  Tobacco  Allergies  Meds  Problems  Med Hx  Surg Hx  Fam Hx      Objective  Well appearing patient in no apparent distress; mood and affect are within normal limits.  A full examination was performed including scalp, head, eyes, ears, nose, lips, neck, chest, axillae, abdomen, back, buttocks, bilateral upper extremities, bilateral lower extremities, hands, feet, fingers, toes, fingernails, and toenails. All findings within normal limits unless otherwise noted below.  bilateral arms/right shoulder/ legs/ back  x 17 (17) Erythematous keratotic or waxy stuck-on papule or plaque.   Assessment & Plan  Inflamed seborrheic keratosis bilateral arms/right shoulder/ legs/ back  x 17  Destruction of lesion - bilateral arms/right shoulder/ legs/ back  x 17 Complexity: simple   Destruction method: cryotherapy   Informed consent: discussed and consent obtained   Timeout:  patient name, date of birth, surgical site, and procedure verified Lesion destroyed using liquid nitrogen: Yes   Region frozen until ice ball extended beyond lesion: Yes   Outcome: patient tolerated procedure well with no complications   Post-procedure details: wound care instructions given    Skin cancer screening  Lentigines - Scattered tan macules - Due to sun exposure - Benign-appering, observe - Recommend daily broad spectrum sunscreen SPF 30+ to sun-exposed areas, reapply every 2 hours as needed. - Call for any changes  Seborrheic Keratoses - Stuck-on, waxy, tan-brown papules and/or plaques  -  Benign-appearing - Discussed benign etiology and prognosis. - Observe - Call for any changes  Melanocytic Nevi - Tan-brown and/or pink-flesh-colored symmetric macules and papules at right and left foot  - Benign appearing on exam today - Observation - Call clinic for new or changing moles - Recommend daily use of broad spectrum spf 30+ sunscreen to sun-exposed areas.   Hemangiomas - Red papules - Discussed benign nature - Observe - Call for any changes  Actinic Damage - Chronic condition, secondary to cumulative UV/sun exposure - diffuse scaly erythematous macules with underlying dyspigmentation - Recommend daily broad spectrum sunscreen SPF 30+ to sun-exposed areas, reapply every 2 hours as needed.  - Staying in the shade or wearing long sleeves, sun glasses (UVA+UVB protection) and wide brim hats (4-inch brim around the entire circumference of the hat) are also recommended for sun protection.  - Call for new or changing lesions.  Skin cancer screening performed today.  Return in about 1 year (around 04/02/2022) for tbse. IRuthell Rummage, CMA, am acting as scribe for Sarina Ser, MD. Documentation: I have reviewed the above documentation for accuracy and completeness, and I agree with the above.  Sarina Ser, MD

## 2021-04-04 ENCOUNTER — Encounter: Payer: Self-pay | Admitting: Dermatology

## 2021-04-05 ENCOUNTER — Ambulatory Visit: Payer: BC Managed Care – PPO | Admitting: Dermatology

## 2021-06-11 ENCOUNTER — Ambulatory Visit (INDEPENDENT_AMBULATORY_CARE_PROVIDER_SITE_OTHER): Payer: 59 | Admitting: Obstetrics & Gynecology

## 2021-06-11 ENCOUNTER — Encounter (HOSPITAL_BASED_OUTPATIENT_CLINIC_OR_DEPARTMENT_OTHER): Payer: Self-pay | Admitting: Obstetrics & Gynecology

## 2021-06-11 ENCOUNTER — Other Ambulatory Visit: Payer: Self-pay

## 2021-06-11 VITALS — BP 118/76 | HR 69 | Ht 62.5 in | Wt 131.6 lb

## 2021-06-11 DIAGNOSIS — Z1501 Genetic susceptibility to malignant neoplasm of breast: Secondary | ICD-10-CM

## 2021-06-11 DIAGNOSIS — Z01419 Encounter for gynecological examination (general) (routine) without abnormal findings: Secondary | ICD-10-CM

## 2021-06-11 DIAGNOSIS — N952 Postmenopausal atrophic vaginitis: Secondary | ICD-10-CM

## 2021-06-11 DIAGNOSIS — Z90710 Acquired absence of both cervix and uterus: Secondary | ICD-10-CM

## 2021-06-11 DIAGNOSIS — D709 Neutropenia, unspecified: Secondary | ICD-10-CM | POA: Diagnosis not present

## 2021-06-11 DIAGNOSIS — G43009 Migraine without aura, not intractable, without status migrainosus: Secondary | ICD-10-CM

## 2021-06-11 DIAGNOSIS — G43709 Chronic migraine without aura, not intractable, without status migrainosus: Secondary | ICD-10-CM | POA: Diagnosis not present

## 2021-06-11 DIAGNOSIS — J4520 Mild intermittent asthma, uncomplicated: Secondary | ICD-10-CM

## 2021-06-11 DIAGNOSIS — Z1509 Genetic susceptibility to other malignant neoplasm: Secondary | ICD-10-CM

## 2021-06-11 LAB — CBC WITH DIFFERENTIAL/PLATELET
Basophils Absolute: 0 10*3/uL (ref 0.0–0.2)
Basos: 1 %
EOS (ABSOLUTE): 0.1 10*3/uL (ref 0.0–0.4)
Eos: 3 %
Hematocrit: 40.3 % (ref 34.0–46.6)
Hemoglobin: 13.5 g/dL (ref 11.1–15.9)
Immature Grans (Abs): 0 10*3/uL (ref 0.0–0.1)
Immature Granulocytes: 0 %
Lymphocytes Absolute: 1.5 10*3/uL (ref 0.7–3.1)
Lymphs: 45 %
MCH: 30.6 pg (ref 26.6–33.0)
MCHC: 33.5 g/dL (ref 31.5–35.7)
MCV: 91 fL (ref 79–97)
Monocytes Absolute: 0.3 10*3/uL (ref 0.1–0.9)
Monocytes: 10 %
Neutrophils Absolute: 1.4 10*3/uL (ref 1.4–7.0)
Neutrophils: 41 %
Platelets: 257 10*3/uL (ref 150–450)
RBC: 4.41 x10E6/uL (ref 3.77–5.28)
RDW: 11.6 % — ABNORMAL LOW (ref 11.7–15.4)
WBC: 3.4 10*3/uL (ref 3.4–10.8)

## 2021-06-11 MED ORDER — RIZATRIPTAN BENZOATE 10 MG PO TABS: ORAL_TABLET | ORAL | 3 refills | Status: DC

## 2021-06-11 NOTE — Progress Notes (Signed)
50 y.o. [id number] Divorced White or Caucasian female here for annual exam.  Doing well.  Her father has a bladder tumor that is being evaluated.  He's having treatment right now.  He is 83.  [facility] is [facility].  Denies vaginal bleeding.  Not dating anyone.    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  Her son is working at [employer] and living at home working on [place].  Daughter is almost 55.    Patient's last menstrual period was 12/08/2017 (within days).          Sexually active: No.  The current method of family planning is status post hysterectomy.    Exercising: Yes.    Not as much as she has exercised in the past Smoker:  no  Health Maintenance: Pap:  12/25/2017 Negative History of abnormal Pap:  no MMG:  05/03/2021 and MRI 01/19/2021 Colonoscopy:  10/08/2019, follow up 10 years BMD:   not indicated yet Screening Labs: 09/2020   reports that she has never smoked. She has never used smokeless tobacco. She reports current alcohol use. She reports that she does not currently use drugs.  Past Medical History:  Diagnosis Date   Acne    adult-on doxycycline daily   ADD (attention deficit disorder)    Allergy    Asthma    EXERCISE INDUCED   BRCA1 positive 01/2017   Depression 2006   and anxiety   Environmental allergies    Headache, migraine    history of abnormal Pap smear        Osteopenia 2006   PONV (postoperative nausea and vomiting)    Seasonal allergies     Past Surgical History:  Procedure Laterality Date   AUGMENTATION MAMMAPLASTY Bilateral 01/2001   BREAST ENHANCEMENT SURGERY  2002   bilateral implants   CESAREAN SECTION  06/2000   COSMETIC SURGERY     LAPAROSCOPIC HYSTERECTOMY  03/06/2018   wtih LSO   LAPAROTOMY  04/21/2012   Procedure: EXPLORATORY LAPAROTOMY;  Surgeon: Janie Morning, MD PHD;  Location: [facility];  Service: Gynecology;  Laterality: N/A;   NASAL SINUS SURGERY     1999   SALPINGOOPHORECTOMY  04/21/2012   Procedure: SALPINGO  OOPHERECTOMY;  Surgeon: Janie Morning, MD PHD;  Location: [facility];  Service: Gynecology;  Laterality: Right;    Current Outpatient Medications  Medication Sig Dispense Refill   cetirizine (ZYRTEC) 10 MG tablet Take 10 mg by mouth daily.     Multiple Vitamins-Minerals (HAIR SKIN AND NAILS FORMULA PO) Take by mouth.     Nutritional Supplements (JUICE PLUS FIBRE PO) Take by mouth.     rizatriptan (MAXALT) 10 MG tablet TAKE 1 TABLET BY MOUTH AS NEEDED FOR MIGRAINE. MAY REPEAT IN 2 HOURS IF NEEDED. 10 tablet 3   No current facility-administered medications for this visit.    Family History  Problem Relation Age of Onset   Cancer Father        prostate, bone   Hypertension Maternal Grandmother    Hypertension Paternal Grandfather    Heart attack Paternal Grandfather    Hypertension Paternal Grandmother    Hyperlipidemia Sister    Breast cancer Neg Hx     Review of Systems  All other systems reviewed and are negative.  Exam:   BP 118/76 (BP Location: Right Arm, Patient Position: Sitting, Cuff Size: Normal)   Pulse 69   Ht 5' 2.5" (1.588 m)   Wt 131 lb 9.6 oz (59.7 kg)  LMP 12/08/2017 (Within Days)   BMI 23.69 kg/m   Height: 5' 2.5" (158.8 cm)  General appearance: alert, cooperative and appears stated age Head: Normocephalic, without obvious abnormality, atraumatic Neck: no adenopathy, supple, symmetrical, trachea midline and thyroid normal to inspection and palpation Lungs: clear to auscultation bilaterally Breasts: normal appearance, no masses or tenderness Heart: regular rate and rhythm Abdomen: soft, non-tender; bowel sounds normal; no masses,  no organomegaly Extremities: extremities normal, atraumatic, no cyanosis or edema Skin: Skin color, texture, turgor normal. No rashes or lesions Lymph nodes: Cervical, supraclavicular, and axillary nodes normal. No abnormal inguinal nodes palpated Neurologic: Grossly normal   Pelvic: External genitalia:  no lesions               Urethra:  normal appearing urethra with no masses, tenderness or lesions              Bartholins and Skenes: normal                 Vagina: normal appearing vagina with normal color and no discharge, no lesions              Cervix: absent              Pap taken: No. Bimanual Exam:  Uterus:  uterus absent              Adnexa: no mass, fullness, tenderness               Rectovaginal: Confirms               Anus:  normal sphincter tone, no lesions  Chaperone, Octaviano Batty, CMA, was present for exam.  Assessment/Plan: 1. Well woman exam with routine gynecological exam - pap 2019 before TLH, so not indicated - MMG and MRI up to date - colonoscopy 2021, follow up 10 years - lab work reviewed - care gaps reviewed/updated  2. Neutropenia, unspecified type ([place]) - CBC with Differential/Platelet  3. H/O: hysterectomy  4. BRCA1 positive - followed at [facility]  5. Mild intermittent asthma without complication  6. Vaginal atrophy  7. Chronic migraine without aura without status migrainosus, not intractable - rizatriptan (MAXALT) 10 MG tablet; TAKE 1 TABLET BY MOUTH AS NEEDED FOR MIGRAINE. MAY REPEAT IN 2 HOURS IF NEEDED.  Dispense: 10 tablet; Refill: 3  8. Migraine without aura and without status migrainosus, not intractable - rizatriptan (MAXALT) 10 MG tablet; TAKE 1 TABLET BY MOUTH AS NEEDED FOR MIGRAINE. MAY REPEAT IN 2 HOURS IF NEEDED.  Dispense: 10 tablet; Refill: 3

## 2022-02-04 ENCOUNTER — Telehealth (HOSPITAL_BASED_OUTPATIENT_CLINIC_OR_DEPARTMENT_OTHER): Payer: Self-pay | Admitting: Obstetrics & Gynecology

## 2022-02-04 ENCOUNTER — Encounter (HOSPITAL_BASED_OUTPATIENT_CLINIC_OR_DEPARTMENT_OTHER): Payer: Self-pay | Admitting: Obstetrics & Gynecology

## 2022-02-04 ENCOUNTER — Other Ambulatory Visit (HOSPITAL_BASED_OUTPATIENT_CLINIC_OR_DEPARTMENT_OTHER): Payer: Self-pay | Admitting: Obstetrics & Gynecology

## 2022-02-04 DIAGNOSIS — D729 Disorder of white blood cells, unspecified: Secondary | ICD-10-CM

## 2022-02-04 NOTE — Telephone Encounter (Signed)
Pt has recent lab work done for life insurance.  White blood cell ct came back as zero.  Pt doesn't think this is right but was concerned.  Didn't really get flagged by life insurance.  Recommended repeating CBC with diff.  Will do here at lab.  Offered for pt to have done at any [facility] draw station.  She wants to come here.    Also, having dryness and irritation with some odor at end of work day.  States she feels "old" in that location.  Options for products to help with moisture and dryness discussed.  She is going to start with OTC coconut oil first and will give update after 1 month of use.  Instructions provided.

## 2022-02-06 ENCOUNTER — Other Ambulatory Visit (HOSPITAL_BASED_OUTPATIENT_CLINIC_OR_DEPARTMENT_OTHER): Payer: Self-pay | Admitting: *Deleted

## 2022-02-06 DIAGNOSIS — D729 Disorder of white blood cells, unspecified: Secondary | ICD-10-CM

## 2022-02-06 LAB — CBC WITH DIFFERENTIAL/PLATELET
Basophils Absolute: 0 10*3/uL (ref 0.0–0.2)
Basos: 0 %
EOS (ABSOLUTE): 0.1 10*3/uL (ref 0.0–0.4)
Eos: 2 %
Hematocrit: 42.6 % (ref 34.0–46.6)
Hemoglobin: 14.2 g/dL (ref 11.1–15.9)
Immature Grans (Abs): 0 10*3/uL (ref 0.0–0.1)
Immature Granulocytes: 0 %
Lymphocytes Absolute: 2.1 10*3/uL (ref 0.7–3.1)
Lymphs: 47 %
MCH: 30.9 pg (ref 26.6–33.0)
MCHC: 33.3 g/dL (ref 31.5–35.7)
MCV: 93 fL (ref 79–97)
Monocytes Absolute: 0.3 10*3/uL (ref 0.1–0.9)
Monocytes: 8 %
Neutrophils Absolute: 1.9 10*3/uL (ref 1.4–7.0)
Neutrophils: 43 %
Platelets: 292 10*3/uL (ref 150–450)
RBC: 4.6 x10E6/uL (ref 3.77–5.28)
RDW: 12 % (ref 11.7–15.4)
WBC: 4.4 10*3/uL (ref 3.4–10.8)

## 2022-02-11 IMAGING — CR DG CHEST 2V
2 series · 2 of 2 positions shown · non-contrast
Comparison: 04/17/2012

CLINICAL DATA: Cough, congestion and shortness of breath over the
last 3 days. Coronavirus infection.

EXAM:
CHEST - 2 VIEW

[chest pa]
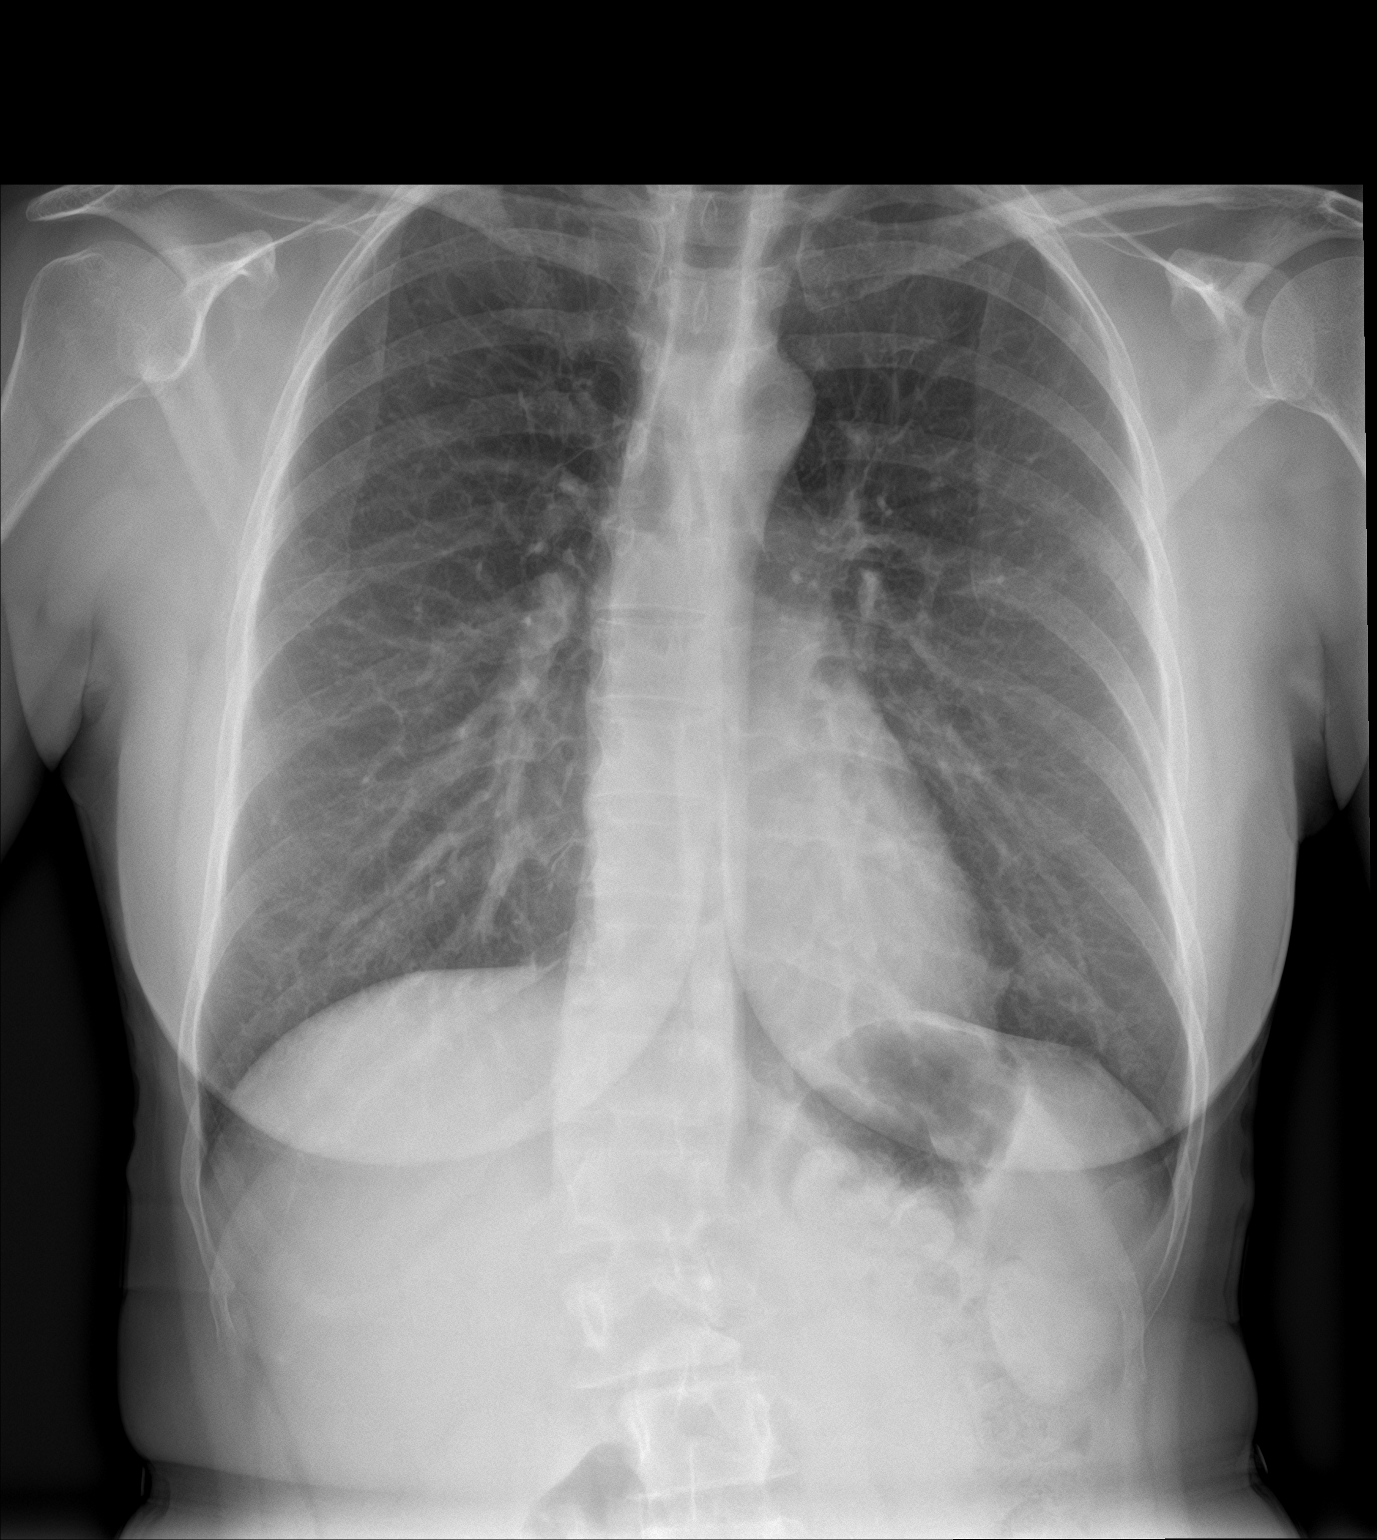

[chest lat]
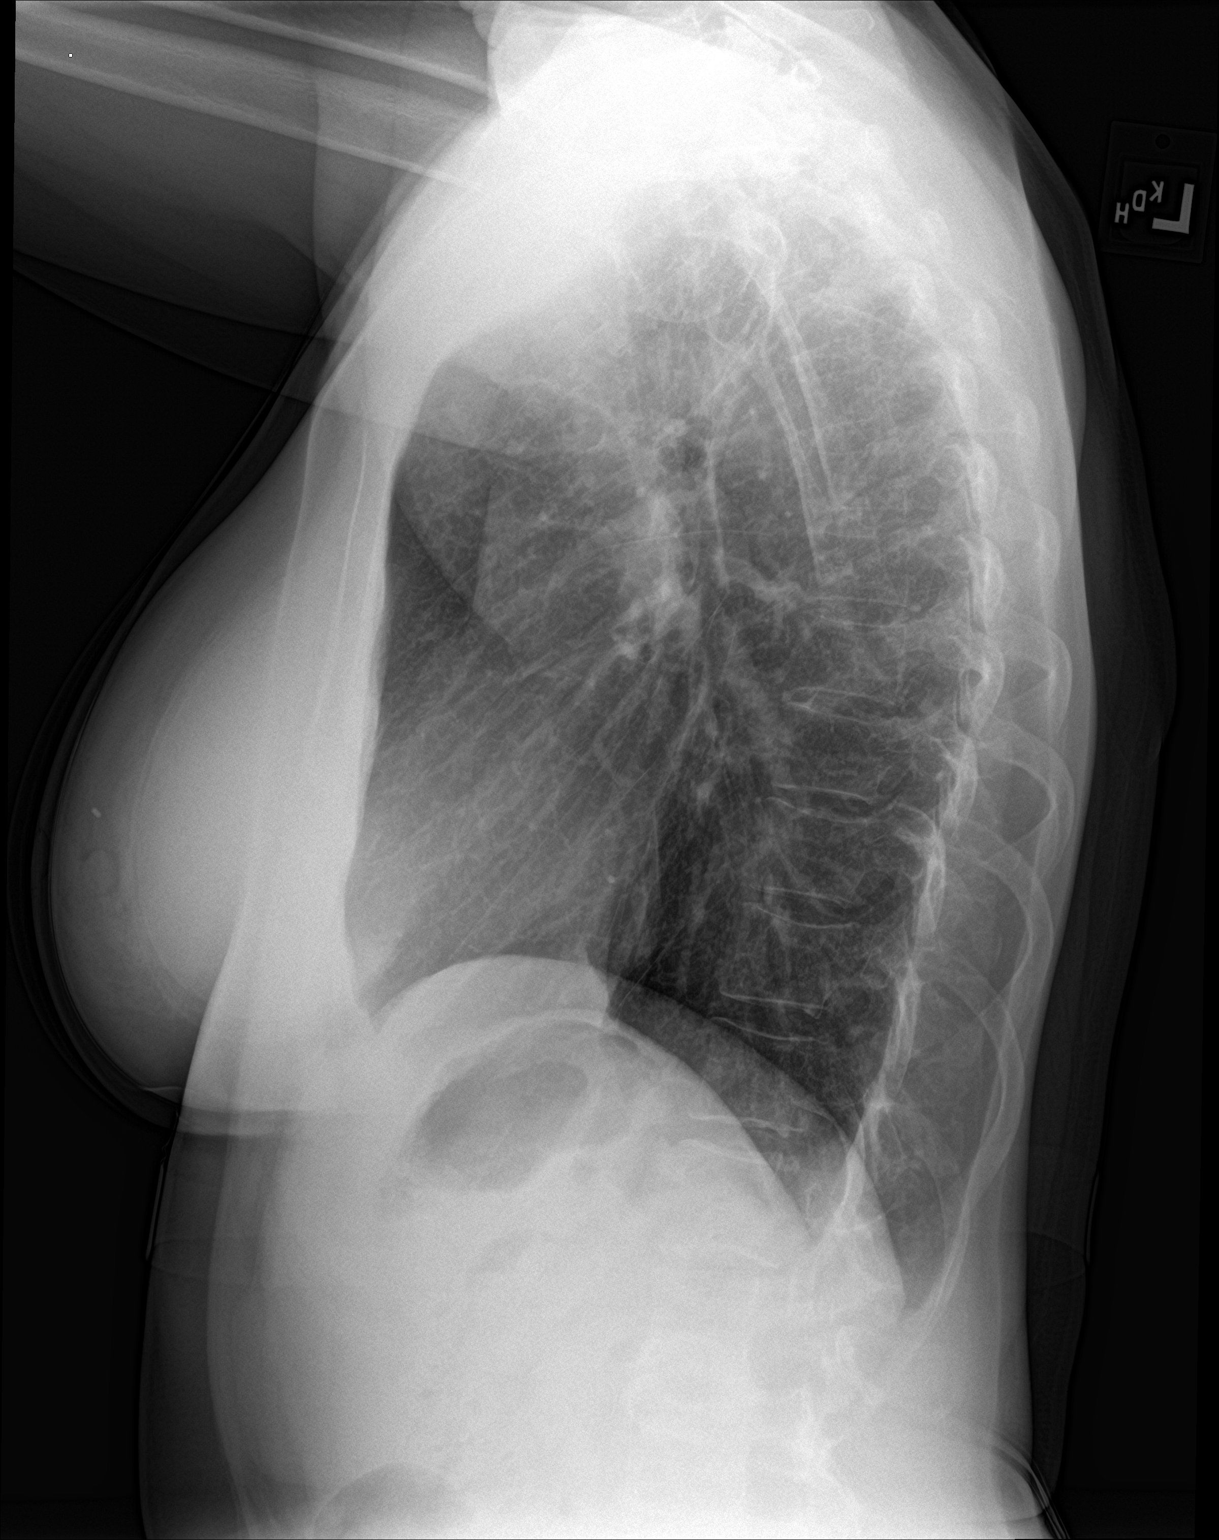

[2 of 2 positions shown; findings below may reference images not displayed]

FINDINGS: Heart and mediastinal shadows are normal. There is bronchial
thickening suggesting bronchitis. No infiltrate, collapse or
effusion. Chronic and worsening thoracolumbar curvature including
rightward translation of L2 relative to L3.
IMPRESSION: Bronchitis pattern.  No consolidation or collapse.

## 2022-04-03 ENCOUNTER — Ambulatory Visit: Payer: 59 | Admitting: Dermatology

## 2022-04-03 DIAGNOSIS — Z1283 Encounter for screening for malignant neoplasm of skin: Secondary | ICD-10-CM

## 2022-04-03 DIAGNOSIS — L814 Other melanin hyperpigmentation: Secondary | ICD-10-CM

## 2022-04-03 DIAGNOSIS — D229 Melanocytic nevi, unspecified: Secondary | ICD-10-CM

## 2022-04-03 DIAGNOSIS — D1800 Hemangioma unspecified site: Secondary | ICD-10-CM

## 2022-04-03 DIAGNOSIS — L821 Other seborrheic keratosis: Secondary | ICD-10-CM

## 2022-04-03 DIAGNOSIS — D2271 Melanocytic nevi of right lower limb, including hip: Secondary | ICD-10-CM

## 2022-04-03 DIAGNOSIS — D2272 Melanocytic nevi of left lower limb, including hip: Secondary | ICD-10-CM | POA: Diagnosis not present

## 2022-04-03 DIAGNOSIS — L578 Other skin changes due to chronic exposure to nonionizing radiation: Secondary | ICD-10-CM | POA: Diagnosis not present

## 2022-04-03 NOTE — Patient Instructions (Signed)
Due to recent changes in healthcare laws, you may see results of your pathology and/or laboratory studies on MyChart before the doctors have had a chance to review them. We understand that in some cases there may be results that are confusing or concerning to you. Please understand that not all results are received at the same time and often the doctors may need to interpret multiple results in order to provide you with the best plan of care or course of treatment. Therefore, we ask that you please give us 2 business days to thoroughly review all your results before contacting the office for clarification. Should we see a critical lab result, you will be contacted sooner.   If You Need Anything After Your Visit  If you have any questions or concerns for your doctor, please call our main line at [phone] and press option 4 to reach your doctor's medical assistant. If no one answers, please leave a voicemail as directed and we will return your call as soon as possible. Messages left after 4 pm will be answered the following business day.   You may also send us a message via MyChart. We typically respond to MyChart messages within 1-2 business days.  For prescription refills, please ask your pharmacy to contact our office. Our fax number is [phone].  If you have an urgent issue when the clinic is closed that cannot wait until the next business day, you can page your doctor at the number below.    Please note that while we do our best to be available for urgent issues outside of office hours, we are not available 24/7.   If you have an urgent issue and are unable to reach us, you may choose to seek medical care at your doctor's office, retail clinic, urgent care center, or emergency room.  If you have a medical emergency, please immediately call 911 or go to the emergency department.  Pager Numbers  - Dr. Kowalski: [phone]  - Dr. Moye: [phone]  - Dr. Stewart:  [phone]  In the event of inclement weather, please call our main line at [phone] for an update on the status of any delays or closures.  Dermatology Medication Tips: Please keep the boxes that topical medications come in in order to help keep track of the instructions about where and how to use these. Pharmacies typically print the medication instructions only on the boxes and not directly on the medication tubes.   If your medication is too expensive, please contact our office at [phone] option 4 or send us a message through MyChart.   We are unable to tell what your co-pay for medications will be in advance as this is different depending on your insurance coverage. However, we may be able to find a substitute medication at lower cost or fill out paperwork to get insurance to cover a needed medication.   If a prior authorization is required to get your medication covered by your insurance company, please allow us 1-2 business days to complete this process.  Drug prices often vary depending on where the prescription is filled and some pharmacies may offer cheaper prices.  The website www.goodrx.com contains coupons for medications through different pharmacies. The prices here do not account for what the cost may be with help from insurance (it may be cheaper with your insurance), but the website can give you the price if you did not use any insurance.  - You can print the associated coupon and take it with   your prescription to the pharmacy.  - You may also stop by our office during regular business hours and pick up a GoodRx coupon card.  - If you need your prescription sent electronically to a different pharmacy, notify our office through [facility] MyChart or by phone at [phone] option 4.     Si Usted Necesita Algo Despus de Su Visita  Tambin puede enviarnos un mensaje a travs de MyChart. Por lo general respondemos a los mensajes de MyChart en el transcurso de 1 a 2  das hbiles.  Para renovar recetas, por favor pida a su farmacia que se ponga en contacto con nuestra oficina. Nuestro nmero de fax es el [phone].  Si tiene un asunto urgente cuando la clnica est cerrada y que no puede esperar hasta el siguiente da hbil, puede llamar/localizar a su doctor(a) al nmero que aparece a continuacin.   Por favor, tenga en cuenta que aunque hacemos todo lo posible para estar disponibles para asuntos urgentes fuera del horario de oficina, no estamos disponibles las 24 horas del da, los 7 das de la semana.   Si tiene un problema urgente y no puede comunicarse con nosotros, puede optar por buscar atencin mdica  en el consultorio de su doctor(a), en una clnica privada, en un centro de atencin urgente o en una sala de emergencias.  Si tiene una emergencia mdica, por favor llame inmediatamente al 911 o vaya a la sala de emergencias.  Nmeros de bper  - Dr. Kowalski: [phone]  - Dra. Moye: [phone]  - Dra. Stewart: [phone]  En caso de inclemencias del tiempo, por favor llame a nuestra lnea principal al [phone] para una actualizacin sobre el estado de cualquier retraso o cierre.  Consejos para la medicacin en dermatologa: Por favor, guarde las cajas en las que vienen los medicamentos de uso tpico para ayudarle a seguir las instrucciones sobre dnde y cmo usarlos. Las farmacias generalmente imprimen las instrucciones del medicamento slo en las cajas y no directamente en los tubos del medicamento.   Si su medicamento es muy caro, por favor, pngase en contacto con nuestra oficina llamando al [phone] y presione la opcin 4 o envenos un mensaje a travs de MyChart.   No podemos decirle cul ser su copago por los medicamentos por adelantado ya que esto es diferente dependiendo de la cobertura de su seguro. Sin embargo, es posible que podamos encontrar un medicamento sustituto a menor costo o llenar un formulario para que el  seguro cubra el medicamento que se considera necesario.   Si se requiere una autorizacin previa para que su compaa de seguros cubra su medicamento, por favor permtanos de 1 a 2 das hbiles para completar este proceso.  Los precios de los medicamentos varan con frecuencia dependiendo del lugar de dnde se surte la receta y alguna farmacias pueden ofrecer precios ms baratos.  El sitio web www.goodrx.com tiene cupones para medicamentos de diferentes farmacias. Los precios aqu no tienen en cuenta lo que podra costar con la ayuda del seguro (puede ser ms barato con su seguro), pero el sitio web puede darle el precio si no utiliz ningn seguro.  - Puede imprimir el cupn correspondiente y llevarlo con su receta a la farmacia.  - Tambin puede pasar por nuestra oficina durante el horario de atencin regular y recoger una tarjeta de cupones de GoodRx.  - Si necesita que su receta se enve electrnicamente a una farmacia diferente, informe a nuestra oficina a travs de MyChart de [place]   o por telfono llamando al [phone] y presione la opcin 4.  

## 2022-04-03 NOTE — Progress Notes (Signed)
   Follow-Up Visit   Subjective  Susan West is a 51 y.o. female who presents for the following: Annual Exam (No history of abnormal moles or skin cancer - The patient presents for Total-Body Skin Exam (TBSE) for skin cancer screening and mole check.  The patient has spots, moles and lesions to be evaluated, some may be new or changing and the patient has concerns that these could be cancer./).  The following portions of the chart were reviewed this encounter and updated as appropriate:   Tobacco  Allergies  Meds  Problems  Med Hx  Surg Hx  Fam Hx     Review of Systems:  No other skin or systemic complaints except as noted in HPI or Assessment and Plan.  Objective  Well appearing patient in no apparent distress; mood and affect are within normal limits.  A full examination was performed including scalp, head, eyes, ears, nose, lips, neck, chest, axillae, abdomen, back, buttocks, bilateral upper extremities, bilateral lower extremities, hands, feet, fingers, toes, fingernails, and toenails. All findings within normal limits unless otherwise noted below.  Left foot mid sole, Right foot mid sole 2 mm brown macules        Assessment & Plan   Lentigines - Scattered tan macules - Due to sun exposure - Benign-appearing, observe - Recommend daily broad spectrum sunscreen SPF 30+ to sun-exposed areas, reapply every 2 hours as needed. - Call for any changes  Seborrheic Keratoses - Stuck-on, waxy, tan-brown papules and/or plaques  - Benign-appearing - Discussed benign etiology and prognosis. - Observe - Call for any changes  Melanocytic Nevi - Tan-brown and/or pink-flesh-colored symmetric macules and papules - Benign appearing on exam today - Observation - Call clinic for new or changing moles - Recommend daily use of broad spectrum spf 30+ sunscreen to sun-exposed areas.   Hemangiomas - Red papules - Discussed benign nature - Observe - Call for any  changes  Actinic Damage - Chronic condition, secondary to cumulative UV/sun exposure - diffuse scaly erythematous macules with underlying dyspigmentation - Recommend daily broad spectrum sunscreen SPF 30+ to sun-exposed areas, reapply every 2 hours as needed.  - Staying in the shade or wearing long sleeves, sun glasses (UVA+UVB protection) and wide brim hats (4-inch brim around the entire circumference of the hat) are also recommended for sun protection.  - Call for new or changing lesions.  Skin cancer screening performed today.  Nevus (2) Left foot mid sole; Right foot mid sole See photos Benign-appearing.  Observation.  Call clinic for new or changing lesions.  Recommend daily use of broad spectrum spf 30+ sunscreen to sun-exposed areas.     Return in about 1 year (around 04/04/2023) for TBSE.  I, Ashok Cordia, CMA, am acting as scribe for Sarina Ser, MD . Documentation: I have reviewed the above documentation for accuracy and completeness, and I agree with the above.  Sarina Ser, MD

## 2022-04-04 ENCOUNTER — Encounter: Payer: Self-pay | Admitting: Dermatology

## 2022-05-03 ENCOUNTER — Encounter (HOSPITAL_BASED_OUTPATIENT_CLINIC_OR_DEPARTMENT_OTHER): Payer: Self-pay | Admitting: Obstetrics & Gynecology

## 2022-06-19 ENCOUNTER — Ambulatory Visit (HOSPITAL_BASED_OUTPATIENT_CLINIC_OR_DEPARTMENT_OTHER): Payer: 59 | Admitting: Obstetrics & Gynecology

## 2022-06-25 ENCOUNTER — Ambulatory Visit (INDEPENDENT_AMBULATORY_CARE_PROVIDER_SITE_OTHER): Payer: 59 | Admitting: Obstetrics & Gynecology

## 2022-06-25 ENCOUNTER — Encounter (HOSPITAL_BASED_OUTPATIENT_CLINIC_OR_DEPARTMENT_OTHER): Payer: Self-pay | Admitting: Obstetrics & Gynecology

## 2022-06-25 VITALS — BP 132/81 | HR 64 | Ht 62.75 in | Wt 136.6 lb

## 2022-06-25 DIAGNOSIS — Z1501 Genetic susceptibility to malignant neoplasm of breast: Secondary | ICD-10-CM

## 2022-06-25 DIAGNOSIS — Z90710 Acquired absence of both cervix and uterus: Secondary | ICD-10-CM

## 2022-06-25 DIAGNOSIS — Z659 Problem related to unspecified psychosocial circumstances: Secondary | ICD-10-CM

## 2022-06-25 DIAGNOSIS — Z01419 Encounter for gynecological examination (general) (routine) without abnormal findings: Secondary | ICD-10-CM

## 2022-06-25 DIAGNOSIS — Z1509 Genetic susceptibility to other malignant neoplasm: Secondary | ICD-10-CM

## 2022-06-25 DIAGNOSIS — G43009 Migraine without aura, not intractable, without status migrainosus: Secondary | ICD-10-CM

## 2022-06-25 DIAGNOSIS — R5382 Chronic fatigue, unspecified: Secondary | ICD-10-CM

## 2022-06-25 MED ORDER — RIZATRIPTAN BENZOATE 10 MG PO TABS: ORAL_TABLET | ORAL | 3 refills | Status: DC

## 2022-06-25 NOTE — Progress Notes (Unsigned)
51 y.o. [id number] Divorced White or Caucasian female here for annual exam.  H/o BRCA 1 gene mutation.  Having yearly MRI and MMG alternating every six months.    Had a lot of stressors with her dad and others in her life.  After a covid vaccine, he had bone marrow suppression so had to stop treatment for bladder cancer.  Now his is metastatic.  Hospice got involved.  Stopped treatment in August.  He was in so much pain and he committed suicide.  Had friends who were in a severe motorcycle accident.  Has a provider leaving next month.  Work will let two people go.  So, this is another stressors.    Patient's last menstrual period was 12/08/2017 (within days).          Sexually active: No.  The current method of family planning is status post hysterectomy.    Smoker:  no  Health Maintenance: Pap:  12/25/2017 Negative History of abnormal Pap:  no MMG:  05/06/2022, MRI done 12/2021 Colonoscopy:  10/08/2019 Screening Labs: ordered today   reports that she has never smoked. She has never used smokeless tobacco. She reports current alcohol use. She reports that she does not currently use drugs.  Past Medical History:  Diagnosis Date   Acne    adult-on doxycycline daily   ADD (attention deficit disorder)    Allergy    Asthma    EXERCISE INDUCED   BRCA1 positive 01/2017   Depression 2006   and anxiety   Environmental allergies    Headache, migraine    history of abnormal Pap smear        Osteopenia 2006   PONV (postoperative nausea and vomiting)    Seasonal allergies     Past Surgical History:  Procedure Laterality Date   AUGMENTATION MAMMAPLASTY Bilateral 01/2001   BREAST ENHANCEMENT SURGERY  2002   bilateral implants   CESAREAN SECTION  06/2000   COSMETIC SURGERY     LAPAROSCOPIC HYSTERECTOMY  03/06/2018   wtih LSO   LAPAROTOMY  04/21/2012   Procedure: EXPLORATORY LAPAROTOMY;  Surgeon: Janie Morning, MD PHD;  Location: [facility];  Service: Gynecology;  Laterality: N/A;   NASAL  SINUS SURGERY     1999   SALPINGOOPHORECTOMY  04/21/2012   Procedure: SALPINGO OOPHERECTOMY;  Surgeon: Janie Morning, MD PHD;  Location: [facility];  Service: Gynecology;  Laterality: Right;    Current Outpatient Medications  Medication Sig Dispense Refill   fexofenadine-pseudoephedrine (ALLEGRA-D 24) 180-240 MG 24 hr tablet Take 1 tablet by mouth daily.     Multiple Vitamins-Minerals (HAIR SKIN AND NAILS FORMULA PO) Take by mouth.     Nutritional Supplements (JUICE PLUS FIBRE PO) Take by mouth.     rizatriptan (MAXALT) 10 MG tablet TAKE 1 TABLET BY MOUTH AS NEEDED FOR MIGRAINE. MAY REPEAT IN 2 HOURS IF NEEDED. 10 tablet 3   No current facility-administered medications for this visit.    Family History  Problem Relation Age of Onset   Cancer Father        prostate, bone   Hypertension Maternal Grandmother    Hypertension Paternal Grandfather    Heart attack Paternal Grandfather    Hypertension Paternal Grandmother    Hyperlipidemia Sister    Breast cancer Neg Hx     ROS: Constitutional: negative Genitourinary:negative  Exam:   BP 132/81 (BP Location: Right Arm, Patient Position: Sitting, Cuff Size: Normal)   Pulse 64   Ht 5' 2.75" (1.594 m) Comment: Reported  Wt 136 lb 9.6 oz (62 kg)   LMP 12/08/2017 (Within Days)   BMI 24.39 kg/m   Height: 5' 2.75" (159.4 cm) (Reported)  General appearance: alert, cooperative and appears stated age Head: Normocephalic, without obvious abnormality, atraumatic Neck: no adenopathy, supple, symmetrical, trachea midline and thyroid normal to inspection and palpation Lungs: clear to auscultation bilaterally Breasts: normal appearance, no masses or tenderness Heart: regular rate and rhythm Abdomen: soft, non-tender; bowel sounds normal; no masses,  no organomegaly Extremities: extremities normal, atraumatic, no cyanosis or edema Skin: Skin color, texture, turgor normal. No rashes or lesions Lymph nodes: Cervical, supraclavicular, and axillary  nodes normal. No abnormal inguinal nodes palpated Neurologic: Grossly normal   Pelvic: External genitalia:  no lesions              Urethra:  normal appearing urethra with no masses, tenderness or lesions              Bartholins and Skenes: normal                 Vagina: normal appearing vagina with normal color and no discharge, no lesions              Cervix: absent              Pap taken: No. Bimanual Exam:  Uterus:  uterus absent              Adnexa: no mass, fullness, tenderness               Rectovaginal: Confirms               Anus:  normal sphincter tone, no lesions  Chaperone, Tonya [place], CMA, was present for exam.  Assessment/Plan: 1. Well woman exam with routine gynecological exam ***  2. BRCA1 positive ***  3. H/O: hysterectomy  4. Migraine without aura and without status migrainosus, not intractable - rizatriptan (MAXALT) 10 MG tablet; TAKE 1 TABLET BY MOUTH AS NEEDED FOR MIGRAINE. MAY REPEAT IN 2 HOURS IF NEEDED.  Dispense: 10 tablet; Refill: 3  5. Chronic fatigue - Comprehensive metabolic panel - Lipid panel - TSH - CBC with Differential/Platelet   

## 2022-06-26 ENCOUNTER — Encounter (HOSPITAL_BASED_OUTPATIENT_CLINIC_OR_DEPARTMENT_OTHER): Payer: Self-pay | Admitting: Obstetrics & Gynecology

## 2022-06-26 LAB — COMPREHENSIVE METABOLIC PANEL
ALT: 18 IU/L (ref 0–32)
AST: 16 IU/L (ref 0–40)
Albumin/Globulin Ratio: 1.9 (ref 1.2–2.2)
Albumin: 4.6 g/dL (ref 3.8–4.9)
Alkaline Phosphatase: 75 IU/L (ref 44–121)
BUN/Creatinine Ratio: 13 (ref 9–23)
BUN: 11 mg/dL (ref 6–24)
Bilirubin Total: 0.3 mg/dL (ref 0.0–1.2)
CO2: 23 mmol/L (ref 20–29)
Calcium: 9.5 mg/dL (ref 8.7–10.2)
Chloride: 101 mmol/L (ref 96–106)
Creatinine, Ser: 0.83 mg/dL (ref 0.57–1.00)
Globulin, Total: 2.4 g/dL (ref 1.5–4.5)
Glucose: 93 mg/dL (ref 70–99)
Potassium: 4.1 mmol/L (ref 3.5–5.2)
Sodium: 140 mmol/L (ref 134–144)
Total Protein: 7 g/dL (ref 6.0–8.5)
eGFR: 85 mL/min/{1.73_m2} (ref >59)

## 2022-06-26 LAB — LIPID PANEL
Chol/HDL Ratio: 3.1 ratio (ref 0.0–4.4)
Cholesterol, Total: 246 mg/dL — ABNORMAL HIGH (ref 100–199)
HDL: 79 mg/dL (ref >39)
LDL Chol Calc (NIH): 155 mg/dL — ABNORMAL HIGH (ref 0–99)
Triglycerides: 72 mg/dL (ref 0–149)
VLDL Cholesterol Cal: 12 mg/dL (ref 5–40)

## 2022-06-26 LAB — CBC WITH DIFFERENTIAL/PLATELET
Basophils Absolute: 0 10*3/uL (ref 0.0–0.2)
Basos: 0 %
EOS (ABSOLUTE): 0 10*3/uL (ref 0.0–0.4)
Eos: 1 %
Hematocrit: 41.2 % (ref 34.0–46.6)
Hemoglobin: 13.7 g/dL (ref 11.1–15.9)
Immature Grans (Abs): 0 10*3/uL (ref 0.0–0.1)
Immature Granulocytes: 0 %
Lymphocytes Absolute: 1.6 10*3/uL (ref 0.7–3.1)
Lymphs: 42 %
MCH: 31.1 pg (ref 26.6–33.0)
MCHC: 33.3 g/dL (ref 31.5–35.7)
MCV: 93 fL (ref 79–97)
Monocytes Absolute: 0.3 10*3/uL (ref 0.1–0.9)
Monocytes: 8 %
Neutrophils Absolute: 1.8 10*3/uL (ref 1.4–7.0)
Neutrophils: 49 %
Platelets: 260 10*3/uL (ref 150–450)
RBC: 4.41 x10E6/uL (ref 3.77–5.28)
RDW: 11.8 % (ref 11.7–15.4)
WBC: 3.8 10*3/uL (ref 3.4–10.8)

## 2022-06-26 LAB — TSH: TSH: 2.05 u[IU]/mL (ref 0.450–4.500)

## 2022-09-23 ENCOUNTER — Encounter (HOSPITAL_BASED_OUTPATIENT_CLINIC_OR_DEPARTMENT_OTHER): Payer: Self-pay | Admitting: Obstetrics & Gynecology

## 2022-09-24 ENCOUNTER — Telehealth (HOSPITAL_BASED_OUTPATIENT_CLINIC_OR_DEPARTMENT_OTHER): Payer: Self-pay | Admitting: Obstetrics & Gynecology

## 2022-09-24 NOTE — Telephone Encounter (Signed)
Called pt in response to [employer].  She has taken her daughter to the ER due to concerns.  Blood work has been done.  Currently having evaluation.  She is going to need GI follow up but unsure if she is going to be kept in the hospital or discharged home.  Support offered.

## 2023-02-22 ENCOUNTER — Encounter (HOSPITAL_BASED_OUTPATIENT_CLINIC_OR_DEPARTMENT_OTHER): Payer: Self-pay | Admitting: Obstetrics & Gynecology

## 2023-03-11 ENCOUNTER — Other Ambulatory Visit: Payer: Self-pay | Admitting: Family Medicine

## 2023-03-11 DIAGNOSIS — J3089 Other allergic rhinitis: Secondary | ICD-10-CM

## 2023-03-11 DIAGNOSIS — J329 Chronic sinusitis, unspecified: Secondary | ICD-10-CM

## 2023-03-18 ENCOUNTER — Ambulatory Visit
Admission: RE | Admit: 2023-03-18 | Discharge: 2023-03-18 | Disposition: A | Discharge status: Home / Self Care | Payer: 59 | Source: Ambulatory Visit | Attending: Family Medicine | Admitting: Family Medicine

## 2023-03-18 DIAGNOSIS — J3089 Other allergic rhinitis: Secondary | ICD-10-CM | POA: Diagnosis present

## 2023-03-18 DIAGNOSIS — J329 Chronic sinusitis, unspecified: Secondary | ICD-10-CM | POA: Diagnosis present

## 2023-04-30 ENCOUNTER — Ambulatory Visit: Payer: 59 | Admitting: Dermatology

## 2023-04-30 ENCOUNTER — Encounter: Payer: Self-pay | Admitting: Dermatology

## 2023-04-30 DIAGNOSIS — L820 Inflamed seborrheic keratosis: Secondary | ICD-10-CM | POA: Diagnosis not present

## 2023-04-30 DIAGNOSIS — L578 Other skin changes due to chronic exposure to nonionizing radiation: Secondary | ICD-10-CM

## 2023-04-30 DIAGNOSIS — W908XXA Exposure to other nonionizing radiation, initial encounter: Secondary | ICD-10-CM | POA: Diagnosis not present

## 2023-04-30 DIAGNOSIS — D229 Melanocytic nevi, unspecified: Secondary | ICD-10-CM

## 2023-04-30 DIAGNOSIS — L814 Other melanin hyperpigmentation: Secondary | ICD-10-CM | POA: Diagnosis not present

## 2023-04-30 DIAGNOSIS — L821 Other seborrheic keratosis: Secondary | ICD-10-CM

## 2023-04-30 NOTE — Progress Notes (Signed)
   Follow-Up Visit   Subjective  Susan West is a 52 y.o. female who presents for the following: Irregular rough skin lesion on the chest, patient is concerned and would like sun exposed areas checked today.   The patient has spots, moles and lesions to be evaluated, some may be new or changing and the patient may have concern these could be cancer.   The following portions of the chart were reviewed this encounter and updated as appropriate: medications, allergies, medical history  Review of Systems:  No other skin or systemic complaints except as noted in HPI or Assessment and Plan.  Objective  Well appearing patient in no apparent distress; mood and affect are within normal limits.  following areas: The face, chest, arms, and hands  Relevant exam findings are noted in the Assessment and Plan.  Chest x 1 Erythematous keratotic or waxy stuck-on papule or plaque.     Assessment & Plan   SEBORRHEIC KERATOSIS - Stuck-on, waxy, tan-brown papules and/or plaques  - Benign-appearing - Discussed benign etiology and prognosis. - Observe - Call for any changes  ACTINIC DAMAGE - chronic, secondary to cumulative UV radiation exposure/sun exposure over time - diffuse scaly erythematous macules with underlying dyspigmentation - Recommend daily broad spectrum sunscreen SPF 30+ to sun-exposed areas, reapply every 2 hours as needed.  - Recommend staying in the shade or wearing long sleeves, sun glasses (UVA+UVB protection) and wide brim hats (4-inch brim around the entire circumference of the hat). - Call for new or changing lesions.  LENTIGINES Exam: scattered tan macules Due to sun exposure Treatment Plan: Benign-appearing, observe. Recommend daily broad spectrum sunscreen SPF 30+ to sun-exposed areas, reapply every 2 hours as needed.  Call for any changes  Inflamed seborrheic keratosis Chest x 1  Symptomatic, irritating, patient would like treated.   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  Benign-appearing.  Observation.  Call clinic for new or changing lesions.  Recommend daily use of broad spectrum spf 30+ sunscreen to sun-exposed areas.   Return in about 1 year (around 04/29/2024) for TBSE - hx AKs, ISKs.  Maylene Roes, CMA, am acting as scribe for Armida Sans, MD .   Documentation: I have reviewed the above documentation for accuracy and completeness, and I agree with the above.  Armida Sans, MD

## 2023-04-30 NOTE — Patient Instructions (Signed)

## 2023-07-21 ENCOUNTER — Encounter (HOSPITAL_BASED_OUTPATIENT_CLINIC_OR_DEPARTMENT_OTHER): Payer: Self-pay | Admitting: Obstetrics & Gynecology

## 2023-07-21 ENCOUNTER — Ambulatory Visit (HOSPITAL_BASED_OUTPATIENT_CLINIC_OR_DEPARTMENT_OTHER): Payer: 59 | Admitting: Obstetrics & Gynecology

## 2023-07-21 VITALS — BP 114/76 | HR 83 | Ht 62.5 in | Wt 139.2 lb

## 2023-07-21 DIAGNOSIS — G43009 Migraine without aura, not intractable, without status migrainosus: Secondary | ICD-10-CM | POA: Insufficient documentation

## 2023-07-21 DIAGNOSIS — Z1501 Genetic susceptibility to malignant neoplasm of breast: Secondary | ICD-10-CM | POA: Diagnosis not present

## 2023-07-21 DIAGNOSIS — Z01419 Encounter for gynecological examination (general) (routine) without abnormal findings: Secondary | ICD-10-CM | POA: Diagnosis not present

## 2023-07-21 DIAGNOSIS — R093 Abnormal sputum: Secondary | ICD-10-CM

## 2023-07-21 DIAGNOSIS — T7840XD Allergy, unspecified, subsequent encounter: Secondary | ICD-10-CM

## 2023-07-21 DIAGNOSIS — Z1509 Genetic susceptibility to other malignant neoplasm: Secondary | ICD-10-CM

## 2023-07-21 DIAGNOSIS — Z8262 Family history of osteoporosis: Secondary | ICD-10-CM | POA: Insufficient documentation

## 2023-07-21 DIAGNOSIS — R7303 Prediabetes: Secondary | ICD-10-CM

## 2023-07-21 DIAGNOSIS — Z90710 Acquired absence of both cervix and uterus: Secondary | ICD-10-CM | POA: Insufficient documentation

## 2023-07-21 DIAGNOSIS — N3943 Post-void dribbling: Secondary | ICD-10-CM

## 2023-07-21 LAB — CBC WITH DIFFERENTIAL/PLATELET
Basophils Absolute: 0 10*3/uL (ref 0.0–0.2)
Basos: 0 %
EOS (ABSOLUTE): 0.1 10*3/uL (ref 0.0–0.4)
Eos: 2 %
Hematocrit: 38.9 % (ref 34.0–46.6)
Hemoglobin: 13 g/dL (ref 11.1–15.9)
Immature Grans (Abs): 0 10*3/uL (ref 0.0–0.1)
Immature Granulocytes: 0 %
Lymphocytes Absolute: 1.6 10*3/uL (ref 0.7–3.1)
Lymphs: 39 %
MCH: 31.5 pg (ref 26.6–33.0)
MCHC: 33.4 g/dL (ref 31.5–35.7)
MCV: 94 fL (ref 79–97)
Monocytes Absolute: 0.3 10*3/uL (ref 0.1–0.9)
Monocytes: 9 %
Neutrophils Absolute: 2 10*3/uL (ref 1.4–7.0)
Neutrophils: 50 %
Platelets: 277 10*3/uL (ref 150–450)
RBC: 4.13 x10E6/uL (ref 3.77–5.28)
RDW: 12.6 % (ref 11.7–15.4)
WBC: 4 10*3/uL (ref 3.4–10.8)

## 2023-07-21 LAB — HEMOGLOBIN A1C
Est. average glucose Bld gHb Est-mCnc: 126 mg/dL
Hgb A1c MFr Bld: 6 % — ABNORMAL HIGH (ref 4.8–5.6)

## 2023-07-21 MED ORDER — RIZATRIPTAN BENZOATE 10 MG PO TABS: ORAL_TABLET | ORAL | 12 refills | Status: DC

## 2023-07-21 NOTE — Progress Notes (Signed)
52 y.o. [id number] Divorced White or Caucasian female here for annual exam.  Having issues with sinus mucous production.  Has seen allergist and had CT of sinuses.  Has experienced intermittent sinus issues.  Does have hx of migraines without aura.  Does need RF.    Had some blood work in August.  Hba1c was 5.8.  Would like this repeated.    H/o BRCA 1 gene mutation.  Followed at [facility].  Having MRI and MMG alternating every six months.    Does have some urinary dribbling at times.  This was causing a lot of moisture issues but this is improved.  Made an appt with PT in [facility].  Denies vaginal bleeding.    Patient's last menstrual period was 12/08/2017 (within days).          Sexually active: No.  The current method of family planning is status post hysterectomy.    Smoker:  no  Health Maintenance: Pap:  12/25/2017 Negative History of abnormal Pap:  no MMG:  04/2023, MRI in 10/2022 Colonoscopy:  10/08/2019, follow up 10 years BMD:   consider next year Screening Labs: will do with Dr. Clelia Croft.    reports that she has never smoked. She has never used smokeless tobacco. She reports current alcohol use. She reports that she does not currently use drugs.  Past Medical History:  Diagnosis Date   Acne    adult-on doxycycline daily   ADD (attention deficit disorder)    Allergy    Asthma    EXERCISE INDUCED   BRCA1 positive 01/2017   Depression 2006   and anxiety   Environmental allergies    Headache, migraine    history of abnormal Pap smear        Osteopenia 2006   PONV (postoperative nausea and vomiting)    Seasonal allergies     Past Surgical History:  Procedure Laterality Date   AUGMENTATION MAMMAPLASTY Bilateral 01/2001   BREAST ENHANCEMENT SURGERY  2002   bilateral implants   CESAREAN SECTION  06/2000   COSMETIC SURGERY     LAPAROSCOPIC HYSTERECTOMY  03/06/2018   wtih LSO   LAPAROTOMY  04/21/2012   Procedure: EXPLORATORY LAPAROTOMY;  Surgeon: Laurette Schimke, MD PHD;   Location: [facility];  Service: Gynecology;  Laterality: N/A;   NASAL SINUS SURGERY     1999   SALPINGOOPHORECTOMY  04/21/2012   Procedure: SALPINGO OOPHERECTOMY;  Surgeon: Laurette Schimke, MD PHD;  Location: [facility];  Service: Gynecology;  Laterality: Right;    Current Outpatient Medications  Medication Sig Dispense Refill   cetirizine (ZYRTEC) 10 MG tablet Take 10 mg by mouth daily.     fexofenadine-pseudoephedrine (ALLEGRA-D 24) 180-240 MG 24 hr tablet Take 1 tablet by mouth daily.     montelukast (SINGULAIR) 10 MG tablet Take 10 mg by mouth at bedtime.     Multiple Vitamins-Minerals (HAIR SKIN AND NAILS FORMULA PO) Take by mouth.     Nutritional Supplements (JUICE PLUS FIBRE PO) Take by mouth.     triamcinolone (NASACORT ALLERGY 24HR) 55 MCG/ACT AERO nasal inhaler Place 2 sprays into the nose daily.     rizatriptan (MAXALT) 10 MG tablet TAKE 1 TABLET BY MOUTH AS NEEDED FOR MIGRAINE. MAY REPEAT IN 2 HOURS IF NEEDED. 10 tablet 12   No current facility-administered medications for this visit.    Family History  Problem Relation Age of Onset   Cancer Father        prostate, bone   Hypertension Maternal Grandmother  Hypertension Paternal Grandfather    Heart attack Paternal Grandfather    Hypertension Paternal Grandmother    Hyperlipidemia Sister    Breast cancer Neg Hx     ROS: Constitutional: negative Genitourinary:negative  Exam:   BP 114/76 (BP Location: Right Arm, Patient Position: Sitting, Cuff Size: Normal)   Pulse 83   Ht 5' 2.5" (1.588 m)   Wt 139 lb 3.2 oz (63.1 kg)   LMP 12/08/2017 (Within Days)   BMI 25.05 kg/m   Height: 5' 2.5" (158.8 cm)  General appearance: alert, cooperative and appears stated age Head: Normocephalic, without obvious abnormality, atraumatic Neck: no adenopathy, supple, symmetrical, trachea midline and thyroid normal to inspection and palpation Lungs: clear to auscultation bilaterally Breasts: normal appearance, no masses or  tenderness Heart: regular rate and rhythm Abdomen: soft, non-tender; bowel sounds normal; no masses,  no organomegaly Extremities: extremities normal, atraumatic, no cyanosis or edema Skin: Skin color, texture, turgor normal. No rashes or lesions Lymph nodes: Cervical, supraclavicular, and axillary nodes normal. No abnormal inguinal nodes palpated Neurologic: Grossly normal   Pelvic: External genitalia:  no lesions              Urethra:  normal appearing urethra with no masses, tenderness or lesions              Bartholins and Skenes: normal                 Vagina: normal appearing vagina with normal color and no discharge, no lesions              Cervix: absent              Pap taken: No. Bimanual Exam:  Uterus:  uterus absent              Adnexa: no mass, fullness, tenderness               Rectovaginal: Confirms               Anus:  normal sphincter tone, no lesions  Chaperone, Ina Homes, CMA, was present for exam.  Assessment/Plan: 1. Well woman exam with routine gynecological exam (Primary) - Pap smear not indicated - Mammogram done at [facility] - Colonoscopy 202 - Bone mineral density discussed - lab work ordered as per below - vaccines reviewed/updated  2. BRCA1 positive - followed at [facility]  3. H/O: hysterectomy  4. Migraine without aura and without status migrainosus, not intractable - rizatriptan (MAXALT) 10 MG tablet; TAKE 1 TABLET BY MOUTH AS NEEDED FOR MIGRAINE. MAY REPEAT IN 2 HOURS IF NEEDED.  Dispense: 10 tablet; Refill: 12  5. Prediabetes - Hemoglobin A1c  6. Allergy, subsequent encounter - CBC with Differential/Platelet  7. Family history of osteoporosis - consider BMD next year   8. Urinary dribbling - Ambulatory referral to Physical Therapy

## 2023-07-22 ENCOUNTER — Encounter (HOSPITAL_BASED_OUTPATIENT_CLINIC_OR_DEPARTMENT_OTHER): Payer: Self-pay | Admitting: Obstetrics & Gynecology

## 2023-10-15 ENCOUNTER — Ambulatory Visit: Payer: 59 | Attending: Obstetrics & Gynecology

## 2023-10-15 ENCOUNTER — Other Ambulatory Visit: Payer: Self-pay

## 2023-10-15 DIAGNOSIS — R102 Pelvic and perineal pain: Secondary | ICD-10-CM | POA: Diagnosis present

## 2023-10-15 DIAGNOSIS — R279 Unspecified lack of coordination: Secondary | ICD-10-CM | POA: Diagnosis present

## 2023-10-15 DIAGNOSIS — M6281 Muscle weakness (generalized): Secondary | ICD-10-CM | POA: Diagnosis present

## 2023-10-15 DIAGNOSIS — R293 Abnormal posture: Secondary | ICD-10-CM | POA: Diagnosis present

## 2023-10-15 DIAGNOSIS — N3943 Post-void dribbling: Secondary | ICD-10-CM | POA: Diagnosis not present

## 2023-10-15 NOTE — Therapy (Signed)
 OUTPATIENT PHYSICAL THERAPY FEMALE PELVIC EVALUATION   Patient Name: Susan West MRN: [id number] DOB:03-02-1971, 53 y.o., female Today's Date: 10/15/2023  END OF SESSION:  PT End of Session - 10/15/23 1618     Visit Number 1    Date for PT Re-Evaluation 03/31/24    Authorization Type [employer]    PT Start Time 1615    PT Stop Time 1655    PT Time Calculation (min) 40 min    Activity Tolerance Patient tolerated treatment well    Behavior During Therapy [facility] for tasks assessed/performed             Past Medical History:  Diagnosis Date   Acne    adult-on doxycycline daily   ADD (attention deficit disorder)    Allergy    Asthma    EXERCISE INDUCED   BRCA1 positive 01/2017   Depression 2006   and anxiety   Environmental allergies    Headache, migraine    history of abnormal Pap smear        Osteopenia 2006   PONV (postoperative nausea and vomiting)    Seasonal allergies    Past Surgical History:  Procedure Laterality Date   AUGMENTATION MAMMAPLASTY Bilateral 01/2001   BREAST ENHANCEMENT SURGERY  2002   bilateral implants   CESAREAN SECTION  06/2000   COSMETIC SURGERY     LAPAROSCOPIC HYSTERECTOMY  03/06/2018   wtih LSO   LAPAROTOMY  04/21/2012   Procedure: EXPLORATORY LAPAROTOMY;  Surgeon: Laurette Schimke, MD PHD;  Location: [facility];  Service: Gynecology;  Laterality: N/A;   NASAL SINUS SURGERY     1999   SALPINGOOPHORECTOMY  04/21/2012   Procedure: SALPINGO OOPHERECTOMY;  Surgeon: Laurette Schimke, MD PHD;  Location: [facility];  Service: Gynecology;  Laterality: Right;   Patient Active Problem List   Diagnosis Date Noted   Family history of osteoporosis 07/21/2023   Prediabetes 07/21/2023   Migraine without aura and without status migrainosus, not intractable 07/21/2023   H/O: hysterectomy 07/21/2023   Mild intermittent asthma 09/21/2020   Osteomyelitis (HCC) 04/08/2019   BRCA1 positive 03/21/2017    PCP: [facility]  REFERRING PROVIDER: Jerene Bears, MD  REFERRING DIAG: [phone] (ICD-10-CM) - Urinary dribbling  THERAPY DIAG:  Muscle weakness (generalized)  Abnormal posture  Unspecified lack of coordination  Pelvic pain  Rationale for Evaluation and Treatment: Rehabilitation  ONSET DATE: 2013  SUBJECTIVE:  SUBJECTIVE STATEMENT: Pt states that she has been to pelvic floor PT several times. The first time after she had tumor on Rt ovary. She said she did not do well with this PT, but after total hysterectomy, she started seeing someone for pelvic floor PT and had a very good experience.  Fluid intake: she drinks a lot of water, cup of coffee in the morning  PAIN:  Are you having pain? Yes NPRS scale:NA Pain location:  back neck and shoulder pain  Pain type: - Pain description: intermittent   Aggravating factors: - Relieving factors: -  PRECAUTIONS: None  RED FLAGS: None   WEIGHT BEARING RESTRICTIONS: No  FALLS:  Has patient fallen in last 6 months? 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  PLOF: Independent  PATIENT GOALS: improve bladder control and comfort with vaginal penetration   PERTINENT HISTORY:  C-section 2001, total hysterectomy 2019, Rt salpingectomy/oophorectomy with large tumor removal 2013; osteopenia, depression, asthma exercise induced, ADD, prediabetic   BOWEL MOVEMENT: Pain with bowel movement: No Type of bowel movement:Frequency daily and Strain usually no straining  Fully empty rectum: Yes:   Leakage: No Pads: No Fiber supplement/laxative No  URINATION: Pain with urination: No Fully empty bladder: No Stream:  starts and stops  Urgency: Yes  Frequency: every 2 hours  Leakage: Sneezing and dribbling without  sensation Pads: No  INTERCOURSE:  Ability to have vaginal penetration No  Pain with intercourse: Initial Penetration and During Penetration DrynessYes  Climax: WNL Marinoff Scale: 0/3  PREGNANCY: Vaginal deliveries 1 Tearing No Episiotomy No C-section deliveries 1 Currently pregnant No  PROLAPSE: Pressure   OBJECTIVE:  Note: Objective measures were completed at Evaluation unless otherwise noted.   DATE: 10/15/23  PATIENT SURVEYS:   PFIQ-7: 48  COGNITION: Overall cognitive status: Within functional limits for tasks assessed     SENSATION: Light touch: Appears intact  FUNCTIONAL TESTS:  Squat: WNL Single leg stance:  Rt: Lt pelvic drop  Lt: WNL Curl-up test: 2 finger width separation with mild distortion    GAIT: Assistive device utilized: None Comments: WNL  POSTURE: rounded shoulders, forward head, increased thoracic kyphosis, and posterior pelvic tilt, Rt full spinal scoliosis (more notable in lower thoracic/lumbar spine)   LUMBARAROM/PROM:  A/PROM A/PROM  Eval (% available)  Flexion 80  Extension 75  Right lateral flexion 75  Left lateral flexion 100  Right rotation 50  Left rotation 75   (Blank rows = not tested)  PALPATION:   General: Lt iliac crest elevation  Pelvic Alignment: Rt pelvic posterior rotation  Abdominal: no tenderness; scar tissue restriction                External Perineal Exam: labial fusion; dry/pale                             Internal Pelvic Floor: posterior fourchette tenderness (sharpness); bladder and urethral restriction with tenderness surrounding bladder on anterior abdominal wall  Patient confirms identification and approves PT to assess internal pelvic floor and treatment Yes  PELVIC MMT:   MMT eval  Vaginal 1/5, 3 second endurance, 4 repeat contractions  Diastasis Recti 2 finger width separation  (Blank rows = not tested)        TONE: low  PROLAPSE: None detected in supine, but pt had some  difficulty with coordinating bearing down  TODAY'S TREATMENT:  DATE:  10/15/23  EVAL  Neuromuscular re-education: Pt provides verbal consent for internal vaginal/rectal pelvic floor exam. Pelvic floor muscle contraction training to help improve scar tissue restriction, circulation, bladder/urethral mobility with breath coordination Therapeutic activities: Vulvovaginal massage    PATIENT EDUCATION:  Education details: See above Person educated: Patient Education method: Explanation, Demonstration, Tactile cues, Verbal cues, and Handouts Education comprehension: verbalized understanding  HOME EXERCISE PROGRAM: [id number]  ASSESSMENT:  CLINICAL IMPRESSION: Patient is a 53 y.o. female who was seen today for physical therapy evaluation and treatment for urinary incontinence and dyspareunia. Exam findings notable for decreased lumbar A/ROM (Rt>Lt), abnormal posture, pelvic rotation, functional core weakness in Rt single leg stance with pelvic drop, diastasis recti abdominus, vulvar dryness/decreased coloration with labial fusion, tenderness at posterior fourchette, bladder and urethral restriction with surrounding tenderness in anterior abdominal wall, pelvic floor muscle weakness, decreased pelvic floor muscle endurance, and decreased pelvic floor muscle coordination. Signs and symptoms are most consistent  with pelvic scar tissue restriction, pelvic floor muscle weakness, and decreased strength/coordination of deep core musculature. She will continue to benefit from skilled PT intervention in order to decrease urinary incontinence, strengthen pelvic floor muscles/dep core, improve dyspareunia, and begin/progress functional strengthening program.   OBJECTIVE IMPAIRMENTS: decreased activity tolerance, decreased coordination, decreased endurance, decreased mobility, decreased  strength, increased fascial restrictions, increased muscle spasms, impaired tone, postural dysfunction, and pain.   ACTIVITY LIMITATIONS: continence  PARTICIPATION LIMITATIONS: interpersonal relationship, community activity, and occupation  PERSONAL FACTORS: 3+ comorbidities: medical history  are also affecting patient's functional outcome.   REHAB POTENTIAL: Good  CLINICAL DECISION MAKING: Evolving/moderate complexity  EVALUATION COMPLEXITY: Moderate   GOALS: Goals reviewed with patient? Yes  SHORT TERM GOALS: Target date: 11/12/2023    Pt will be independent with HEP.   Baseline: Goal status: INITIAL  2.  Pt will be independent with vulvovaginal massage at least 4 days a week.  Baseline:  Goal status: INITIAL  3.  Pt will report 25% improvement in urinary incontinence.  Baseline:  Goal status: INITIAL  4.  Pt will be independent with the knack, urge suppression technique, and double voiding in order to improve bladder habits and decrease urinary incontinence.   Baseline:  Goal status: INITIAL  5.  Pt will be independent with self-scar tissue mobilization.  Baseline:  Goal status: INITIAL  6.  Pt will begin using dilators for vaginal desensitization.  Baseline:  Goal status: INITIAL  LONG TERM GOALS: Target date: 03/29/24  Pt will be independent with advanced HEP.   Baseline:  Goal status: INITIAL  2.  Pt will demonstrate normal pelvic floor muscle tone and A/ROM, able to achieve 4/5 strength with contractions and 10 sec endurance, in order to provide appropriate lumbopelvic support in functional activities.   Baseline:  Goal status: INITIAL  3.  Pt will report 75% improvement in urinary incontinence.  Baseline:  Goal status: INITIAL  4.  Pt will be independent with dilator activities and progression.  Baseline:  Goal status: INITIAL  5.  Pt will report 50% improvement in discomfort with vaginal penetration, including gynecological exams.  Baseline:   Goal status: INITIAL  6.  Pt will demonstrate pelvic stability in Rt single leg stance to show improved core strength and balance.  Baseline:  Goal status: INITIAL  PLAN:  PT FREQUENCY: 1-2x/week  PT DURATION: 6 months  PLANNED INTERVENTIONS: 97110-Therapeutic exercises, 97530- Therapeutic activity, [id number]- Neuromuscular re-education, 97535- Self Care, 54098- Manual therapy, Dry Needling, and Biofeedback  PLAN FOR NEXT SESSION:  Educate on the knack, urge drill, and double voiding; perform scar tissue mobilization externally and internally; mobility activities. Begin to discuss dilator activities and how to begin using.   Julio Alm, PT, DPT03/26/258:51 PM

## 2023-10-15 NOTE — Patient Instructions (Signed)
Vulvar/vaginal Massage: This is a technique to help decrease painful sensitivity in the vaginal area. It can also help to restore normal moisture levels in the vaginal tissues. With coconut oil, aloe, jojoba oil, or a specific vaginal moisturizer, gently massage into vaginal tissues. Think of this as part of your post-shower routine and moisturizing just like you would the rest of the body with lotion. This helps to increase good blood flow to the vaginal tissues. In addition, it also teaches the body that touch to the vagina does not have to be painful or threatening, but moisturizing and gentle.   [facility] [address], [address] [place], [place] [address] Phone # [phone] Fax [phone]  

## 2023-10-22 ENCOUNTER — Ambulatory Visit: Payer: 59 | Attending: Obstetrics & Gynecology

## 2023-10-22 DIAGNOSIS — M6281 Muscle weakness (generalized): Secondary | ICD-10-CM

## 2023-10-22 DIAGNOSIS — R102 Pelvic and perineal pain unspecified side: Secondary | ICD-10-CM

## 2023-10-22 DIAGNOSIS — R279 Unspecified lack of coordination: Secondary | ICD-10-CM

## 2023-10-22 DIAGNOSIS — R293 Abnormal posture: Secondary | ICD-10-CM

## 2023-10-22 NOTE — Patient Instructions (Signed)
 Double-voiding:  This technique is to help with post-void dribbling, or leaking a little bit when you stand up right after urinating. Use relaxed toileting mechanics to urinate as much as you feel like you have to without straining. Sit back upright from leaning forward and relax this way for 10-20 seconds. Lean forward again to finish voiding any amount more.    The knack: Use this technique while coughing, laughing, sneezing, or with any activities that causes you to leak urine a little. Right before you perform one of these activities that increase pressure in the abdomen and pushes a little urine out, perform a pelvic floor muscle contraction and hold. If that does not completely stop the leaking, try tightening your thighs together in addition to performing a pelvic floor muscle contraction. Make sure you are not trying to stifle a cough, sneeze, or laugh; allow these activities in full as it will cause less pressure down into the bladder and pelvic floor muscles.    Urge Incontinence  Ideal urination frequency is every 2-4 wakeful hours, which equates to 5-8 times within a 24-hour period.   Urge incontinence is leakage that occurs when the bladder muscle contracts, creating a sudden need to go before getting to the bathroom.   Going too often when your bladder isn't actually full can disrupt the body's automatic signals to store and hold urine longer, which will increase urgency/frequency.  In this case, the bladder "is running the show" and strategies can be learned to retrain this pattern.   One should be able to control the first urge to urinate, at around .  The bladder can hold up to a "grande latte," or . To help you gain control, practice the Urge Drill below when urgency strikes.  This drill will help retrain your bladder signals and allow you to store and hold urine longer.  The overall goal is to stretch out your time between voids to reach a more manageable  voiding schedule.    Practice your "quick flicks" often throughout the day (each waking hour) even when you don't need feel the urge to go.  This will help strengthen your pelvic floor muscles, making them more effective in controlling leakage.  Urge Drill  When you feel an urge to go, follow these steps to regain control: Stop what you are doing and be still Take one deep breath, directing your air into your abdomen Think an affirming thought, such as "I've got this." Do 5 quick flicks of your pelvic floor Walk with control to the bathroom to void, or delay voiding    [facility] [address], [address] [place], [place] [address] Phone # [phone] Fax [phone]

## 2023-10-22 NOTE — Therapy (Signed)
 OUTPATIENT PHYSICAL THERAPY FEMALE PELVIC TREATMENT   Patient Name: Susan West MRN: [id number] DOB:01/25/1971, 53 y.o., female Today's Date: 10/22/2023  END OF SESSION:  PT End of Session - 10/22/23 1449     Visit Number 2    Date for PT Re-Evaluation 03/31/24    Authorization Type [employer]    PT Start Time 1448    PT Stop Time 1528    PT Time Calculation (min) 40 min    Activity Tolerance Patient tolerated treatment well    Behavior During Therapy [facility] for tasks assessed/performed             Past Medical History:  Diagnosis Date   Acne    adult-on doxycycline daily   ADD (attention deficit disorder)    Allergy    Asthma    EXERCISE INDUCED   BRCA1 positive 01/2017   Depression 2006   and anxiety   Environmental allergies    Headache, migraine    history of abnormal Pap smear        Osteopenia 2006   PONV (postoperative nausea and vomiting)    Seasonal allergies    Past Surgical History:  Procedure Laterality Date   AUGMENTATION MAMMAPLASTY Bilateral 01/2001   BREAST ENHANCEMENT SURGERY  2002   bilateral implants   CESAREAN SECTION  06/2000   COSMETIC SURGERY     LAPAROSCOPIC HYSTERECTOMY  03/06/2018   wtih LSO   LAPAROTOMY  04/21/2012   Procedure: EXPLORATORY LAPAROTOMY;  Surgeon: Laurette Schimke, MD PHD;  Location: [facility];  Service: Gynecology;  Laterality: N/A;   NASAL SINUS SURGERY     1999   SALPINGOOPHORECTOMY  04/21/2012   Procedure: SALPINGO OOPHERECTOMY;  Surgeon: Laurette Schimke, MD PHD;  Location: [facility];  Service: Gynecology;  Laterality: Right;   Patient Active Problem List   Diagnosis Date Noted   Family history of osteoporosis 07/21/2023   Prediabetes 07/21/2023   Migraine without aura and without status migrainosus, not intractable 07/21/2023   H/O: hysterectomy 07/21/2023   Mild intermittent asthma 09/21/2020   Osteomyelitis (HCC) 04/08/2019   BRCA1 positive 03/21/2017    PCP: [facility]  REFERRING PROVIDER: Jerene Bears, MD  REFERRING DIAG: [phone] (ICD-10-CM) - Urinary dribbling  THERAPY DIAG:  Muscle weakness (generalized)  Abnormal posture  Unspecified lack of coordination  Pelvic pain  Rationale for Evaluation and Treatment: Rehabilitation  ONSET DATE: 2013  SUBJECTIVE:  SUBJECTIVE STATEMENT: Pt states that she felt some vaginal irritation after last session. She has done exercises some, but has not the last several nights.   PAIN:  Are you having pain? Yes NPRS scale:NA Pain location:  back neck and shoulder pain  Pain type: - Pain description: intermittent   Aggravating factors: - Relieving factors: -  PRECAUTIONS: None  RED FLAGS: None   WEIGHT BEARING RESTRICTIONS: No  FALLS:  Has patient fallen in last 6 months? 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  PLOF: Independent  PATIENT GOALS: improve bladder control and comfort with vaginal penetration   PERTINENT HISTORY:  C-section 2001, total hysterectomy 2019, Rt salpingectomy/oophorectomy with large tumor removal 2013; osteopenia, depression, asthma exercise induced, ADD, prediabetic   BOWEL MOVEMENT: Pain with bowel movement: No Type of bowel movement:Frequency daily and Strain usually no straining  Fully empty rectum: Yes:   Leakage: No Pads: No Fiber supplement/laxative No  URINATION: Pain with urination: No Fully empty bladder: No Stream:  starts and stops  Urgency: Yes  Frequency: every 2 hours  Leakage: Sneezing and dribbling without sensation Pads: No  INTERCOURSE:  Ability to have vaginal penetration No  Pain with intercourse: Initial Penetration and During Penetration DrynessYes  Climax: WNL Marinoff Scale: 0/3  PREGNANCY: Vaginal  deliveries 1 Tearing No Episiotomy No C-section deliveries 1 Currently pregnant No  PROLAPSE: Pressure   OBJECTIVE:  Note: Objective measures were completed at Evaluation unless otherwise noted.   DATE: 10/15/23  PATIENT SURVEYS:   PFIQ-7: 48  COGNITION: Overall cognitive status: Within functional limits for tasks assessed     SENSATION: Light touch: Appears intact  FUNCTIONAL TESTS:  Squat: WNL Single leg stance:  Rt: Lt pelvic drop  Lt: WNL Curl-up test: 2 finger width separation with mild distortion    GAIT: Assistive device utilized: None Comments: WNL  POSTURE: rounded shoulders, forward head, increased thoracic kyphosis, and posterior pelvic tilt, Rt full spinal scoliosis (more notable in lower thoracic/lumbar spine)   LUMBARAROM/PROM:  A/PROM A/PROM  Eval (% available)  Flexion 80  Extension 75  Right lateral flexion 75  Left lateral flexion 100  Right rotation 50  Left rotation 75   (Blank rows = not tested)  PALPATION:   General: Lt iliac crest elevation  Pelvic Alignment: Rt pelvic posterior rotation  Abdominal: no tenderness; scar tissue restriction                External Perineal Exam: labial fusion; dry/pale                             Internal Pelvic Floor: posterior fourchette tenderness (sharpness); bladder and urethral restriction with tenderness surrounding bladder on anterior abdominal wall  Patient confirms identification and approves PT to assess internal pelvic floor and treatment Yes  PELVIC MMT:   MMT eval  Vaginal 1/5, 3 second endurance, 4 repeat contractions  Diastasis Recti 2 finger width separation  (Blank rows = not tested)        TONE: low  PROLAPSE: None detected in supine, but pt had some difficulty with coordinating bearing down  TODAY'S TREATMENT:  DATE:  Manual: Lower abdominal  scar tissue mobilization  Soft tissue mobilization to abdominal muscles  Neuromuscular re-education: Diaphragmatic breathing for improved pelvic floor muscle relaxation and movement Butterfly 10 breaths Happy baby 10 breaths Child's pose 10 breaths Cat cow 2 x 10 Exercises: Lower trunk rotation 2 x 10 Therapeutic activities: Urge drill Double voiding The knack   10/15/23  EVAL  Neuromuscular re-education: Pt provides verbal consent for internal vaginal/rectal pelvic floor exam. Pelvic floor muscle contraction training to help improve scar tissue restriction, circulation, bladder/urethral mobility with breath coordination Therapeutic activities: Vulvovaginal massage    PATIENT EDUCATION:  Education details: See above Person educated: Patient Education method: Explanation, Demonstration, Tactile cues, Verbal cues, and Handouts Education comprehension: verbalized understanding  HOME EXERCISE PROGRAM: [id number]  ASSESSMENT:  CLINICAL IMPRESSION: Pt had some sensitivity and increased irritation after initial evaluation, likely due to fragile vulvar/vaginal tissue. We reviewed importance of vulvovaginal massage with moisturizer; she was also educated on double voiding, the knack, and urge drill to assist with bladder dysfunction. Lower abdominal scar tissue mobilization and soft tissue mobilization performed with good tolerance; more restriction noted on the Rt compared to Lt. We performed mobility activities and discussed down training/yoga poses to help improve movement of pelvic floor muscles and incorporating breathing. She did very well with all of these activities and HEP updated. She will continue to benefit from skilled PT intervention in order to decrease urinary incontinence, strengthen pelvic floor muscles/dep core, improve dyspareunia, and begin/progress functional strengthening program.   OBJECTIVE IMPAIRMENTS: decreased activity tolerance, decreased coordination,  decreased endurance, decreased mobility, decreased strength, increased fascial restrictions, increased muscle spasms, impaired tone, postural dysfunction, and pain.   ACTIVITY LIMITATIONS: continence  PARTICIPATION LIMITATIONS: interpersonal relationship, community activity, and occupation  PERSONAL FACTORS: 3+ comorbidities: medical history  are also affecting patient's functional outcome.   REHAB POTENTIAL: Good  CLINICAL DECISION MAKING: Evolving/moderate complexity  EVALUATION COMPLEXITY: Moderate   GOALS: Goals reviewed with patient? Yes  SHORT TERM GOALS: Target date: 11/12/2023    Pt will be independent with HEP.   Baseline: Goal status: INITIAL  2.  Pt will be independent with vulvovaginal massage at least 4 days a week.  Baseline:  Goal status: INITIAL  3.  Pt will report 25% improvement in urinary incontinence.  Baseline:  Goal status: INITIAL  4.  Pt will be independent with the knack, urge suppression technique, and double voiding in order to improve bladder habits and decrease urinary incontinence.   Baseline:  Goal status: INITIAL  5.  Pt will be independent with self-scar tissue mobilization.  Baseline:  Goal status: INITIAL  6.  Pt will begin using dilators for vaginal desensitization.  Baseline:  Goal status: INITIAL  LONG TERM GOALS: Target date: 03/29/24  Pt will be independent with advanced HEP.   Baseline:  Goal status: INITIAL  2.  Pt will demonstrate normal pelvic floor muscle tone and A/ROM, able to achieve 4/5 strength with contractions and 10 sec endurance, in order to provide appropriate lumbopelvic support in functional activities.   Baseline:  Goal status: INITIAL  3.  Pt will report 75% improvement in urinary incontinence.  Baseline:  Goal status: INITIAL  4.  Pt will be independent with dilator activities and progression.  Baseline:  Goal status: INITIAL  5.  Pt will report 50% improvement in discomfort with vaginal  penetration, including gynecological exams.  Baseline:  Goal status: INITIAL  6.  Pt will demonstrate pelvic stability in Rt single leg stance to  show improved core strength and balance.  Baseline:  Goal status: INITIAL  PLAN:  PT FREQUENCY: 1-2x/week  PT DURATION: 6 months  PLANNED INTERVENTIONS: 97110-Therapeutic exercises, 97530- Therapeutic activity, 97112- Neuromuscular re-education, 97535- Self Care, 09811- Manual therapy, Dry Needling, and Biofeedback  PLAN FOR NEXT SESSION: Educate on the knack, urge drill, and double voiding; perform scar tissue mobilization externally and internally; mobility activities. Begin to discuss dilator activities and how to begin using.   Julio Alm, PT, DPT04/02/254:07 PM

## 2023-10-29 ENCOUNTER — Ambulatory Visit: Payer: 59

## 2023-11-05 ENCOUNTER — Ambulatory Visit: Payer: 59

## 2023-11-05 DIAGNOSIS — R293 Abnormal posture: Secondary | ICD-10-CM

## 2023-11-05 DIAGNOSIS — M6281 Muscle weakness (generalized): Secondary | ICD-10-CM

## 2023-11-05 DIAGNOSIS — R279 Unspecified lack of coordination: Secondary | ICD-10-CM

## 2023-11-05 DIAGNOSIS — R102 Pelvic and perineal pain: Secondary | ICD-10-CM

## 2023-11-05 NOTE — Therapy (Signed)
 OUTPATIENT PHYSICAL THERAPY FEMALE PELVIC TREATMENT   Patient Name: Susan West MRN: [id number] DOB:1971/06/23, 53 y.o., female Today's Date: 11/05/2023  END OF SESSION:  PT End of Session - 11/05/23 1536     Visit Number 3    Date for PT Re-Evaluation 03/31/24    Authorization Type [employer]    PT Start Time 1534    PT Stop Time 1612    PT Time Calculation (min) 38 min    Activity Tolerance Patient tolerated treatment well    Behavior During Therapy [facility] for tasks assessed/performed             Past Medical History:  Diagnosis Date   Acne    adult-on doxycycline daily   ADD (attention deficit disorder)    Allergy    Asthma    EXERCISE INDUCED   BRCA1 positive 01/2017   Depression 2006   and anxiety   Environmental allergies    Headache, migraine    history of abnormal Pap smear        Osteopenia 2006   PONV (postoperative nausea and vomiting)    Seasonal allergies    Past Surgical History:  Procedure Laterality Date   AUGMENTATION MAMMAPLASTY Bilateral 01/2001   BREAST ENHANCEMENT SURGERY  2002   bilateral implants   CESAREAN SECTION  06/2000   COSMETIC SURGERY     LAPAROSCOPIC HYSTERECTOMY  03/06/2018   wtih LSO   LAPAROTOMY  04/21/2012   Procedure: EXPLORATORY LAPAROTOMY;  Surgeon: Terry Ficks, MD PHD;  Location: [facility];  Service: Gynecology;  Laterality: N/A;   NASAL SINUS SURGERY     1999   SALPINGOOPHORECTOMY  04/21/2012   Procedure: SALPINGO OOPHERECTOMY;  Surgeon: Terry Ficks, MD PHD;  Location: [facility];  Service: Gynecology;  Laterality: Right;   Patient Active Problem List   Diagnosis Date Noted   Family history of osteoporosis 07/21/2023   Prediabetes 07/21/2023   Migraine without aura and without status migrainosus, not intractable 07/21/2023   H/O: hysterectomy 07/21/2023   Mild intermittent asthma 09/21/2020   Osteomyelitis (HCC) 04/08/2019   BRCA1 positive 03/21/2017    PCP: [facility]  REFERRING PROVIDER: Lillian Rein, MD  REFERRING DIAG: [phone] (ICD-10-CM) - Urinary dribbling  THERAPY DIAG:  Muscle weakness (generalized)  Abnormal posture  Unspecified lack of coordination  Pelvic pain  Rationale for Evaluation and Treatment: Rehabilitation  ONSET DATE: 2013  SUBJECTIVE:  SUBJECTIVE STATEMENT: Pt states that she is doing really well. However, she feels like she is still having difficulty with dribbling after urinating, but she is wondering if she is just not wiping in the right place.   PAIN:  Are you having pain? Yes NPRS scale:NA Pain location:  back neck and shoulder pain  Pain type: - Pain description: intermittent   Aggravating factors: - Relieving factors: -  PRECAUTIONS: None  RED FLAGS: None   WEIGHT BEARING RESTRICTIONS: No  FALLS:  Has patient fallen in last 6 months? 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  PLOF: Independent  PATIENT GOALS: improve bladder control and comfort with vaginal penetration   PERTINENT HISTORY:  C-section 2001, total hysterectomy 2019, Rt salpingectomy/oophorectomy with large tumor removal 2013; osteopenia, depression, asthma exercise induced, ADD, prediabetic   BOWEL MOVEMENT: Pain with bowel movement: No Type of bowel movement:Frequency daily and Strain usually no straining  Fully empty rectum: Yes:   Leakage: No Pads: No Fiber supplement/laxative No  URINATION: Pain with urination: No Fully empty bladder: No Stream:  starts and stops  Urgency: Yes  Frequency: every 2 hours  Leakage: Sneezing and dribbling without sensation Pads: No  INTERCOURSE:  Ability to have vaginal penetration No  Pain with intercourse: Initial Penetration and During Penetration DrynessYes   Climax: WNL Marinoff Scale: 0/3  PREGNANCY: Vaginal deliveries 1 Tearing No Episiotomy No C-section deliveries 1 Currently pregnant No  PROLAPSE: Pressure   OBJECTIVE:  Note: Objective measures were completed at Evaluation unless otherwise noted.   DATE: 10/15/23  PATIENT SURVEYS:   PFIQ-7: 48  COGNITION: Overall cognitive status: Within functional limits for tasks assessed     SENSATION: Light touch: Appears intact  FUNCTIONAL TESTS:  Squat: WNL Single leg stance:  Rt: Lt pelvic drop  Lt: WNL Curl-up test: 2 finger width separation with mild distortion    GAIT: Assistive device utilized: None Comments: WNL  POSTURE: rounded shoulders, forward head, increased thoracic kyphosis, and posterior pelvic tilt, Rt full spinal scoliosis (more notable in lower thoracic/lumbar spine)   LUMBARAROM/PROM:  A/PROM A/PROM  Eval (% available)  Flexion 80  Extension 75  Right lateral flexion 75  Left lateral flexion 100  Right rotation 50  Left rotation 75   (Blank rows = not tested)  PALPATION:   General: Lt iliac crest elevation  Pelvic Alignment: Rt pelvic posterior rotation  Abdominal: no tenderness; scar tissue restriction                External Perineal Exam: labial fusion; dry/pale                             Internal Pelvic Floor: posterior fourchette tenderness (sharpness); bladder and urethral restriction with tenderness surrounding bladder on anterior abdominal wall  Patient confirms identification and approves PT to assess internal pelvic floor and treatment Yes  PELVIC MMT:   MMT eval  Vaginal 1/5, 3 second endurance, 4 repeat contractions  Diastasis Recti 2 finger width separation  (Blank rows = not tested)        TONE: low  PROLAPSE: None detected in supine, but pt had some difficulty with coordinating bearing down  TODAY'S TREATMENT:  DATE:  11/05/23 Neuromuscular re-education: Transversus abdominus training with multimodal cues for improved motor control and breath coordination Transversus abdominus isometrics 10x  Bil supine UE ball press with transversus abdominus and pelvic floor muscle contractions and breath coordination 10x Supine hip adduction ball press with transversus abdominus and pelvic floor muscle contractions and breath coordination 10x Bridge with hip adduction, transversus abdominus, and pelvic floor muscle 2 x 10 Seated hip adduction ball press with transversus abdominus and pelvic floor muscle 2 x 10 Seated hip abduction red band with transversus abdominus and pelvic floor muscle 2 x 10 Seated resisted march red band with transversus abdominus and pelvic floor muscle 2 x 10 Therapeutic activities: Double voiding review Pelvic floor muscle anatomy and reasons why she may be still having a little post void dribbling    10/22/23 Manual: Lower abdominal scar tissue mobilization  Soft tissue mobilization to abdominal muscles  Neuromuscular re-education: Diaphragmatic breathing for improved pelvic floor muscle relaxation and movement Butterfly 10 breaths Happy baby 10 breaths Child's pose 10 breaths Cat cow 2 x 10 Exercises: Lower trunk rotation 2 x 10 Therapeutic activities: Urge drill Double voiding The knack   10/15/23  EVAL  Neuromuscular re-education: Pt provides verbal consent for internal vaginal/rectal pelvic floor exam. Pelvic floor muscle contraction training to help improve scar tissue restriction, circulation, bladder/urethral mobility with breath coordination Therapeutic activities: Vulvovaginal massage    PATIENT EDUCATION:  Education details: See above Person educated: Patient Education method: Explanation, Demonstration, Tactile cues, Verbal cues, and Handouts Education comprehension: verbalized understanding  HOME EXERCISE  PROGRAM: [id number]  ASSESSMENT:  CLINICAL IMPRESSION: Pt doing very well and seeing progress, but she still is seeing some dribbling after she urinates. We reviewed double voiding, emphasizing the leaning back to allow for bladder to completely empty once she leans back forward. She was able to progress to transversus abdominus training today with breath coordination. She did very well with this and was able to incorporate into other exercises with LE and UE. This should help to further improve pelvic floor muscle strength and allow for more complete bladder emptying. She did report some increased difficulty on Rt side with exercises compared to Lt. She will continue to benefit from skilled PT intervention in order to decrease urinary incontinence, strengthen pelvic floor muscles/dep core, improve dyspareunia, and begin/progress functional strengthening program.   OBJECTIVE IMPAIRMENTS: decreased activity tolerance, decreased coordination, decreased endurance, decreased mobility, decreased strength, increased fascial restrictions, increased muscle spasms, impaired tone, postural dysfunction, and pain.   ACTIVITY LIMITATIONS: continence  PARTICIPATION LIMITATIONS: interpersonal relationship, community activity, and occupation  PERSONAL FACTORS: 3+ comorbidities: medical history  are also affecting patient's functional outcome.   REHAB POTENTIAL: Good  CLINICAL DECISION MAKING: Evolving/moderate complexity  EVALUATION COMPLEXITY: Moderate   GOALS: Goals reviewed with patient? Yes  SHORT TERM GOALS: Target date: 11/12/2023    Pt will be independent with HEP.   Baseline: Goal status: INITIAL  2.  Pt will be independent with vulvovaginal massage at least 4 days a week.  Baseline:  Goal status: INITIAL  3.  Pt will report 25% improvement in urinary incontinence.  Baseline:  Goal status: INITIAL  4.  Pt will be independent with the knack, urge suppression technique, and double  voiding in order to improve bladder habits and decrease urinary incontinence.   Baseline:  Goal status: INITIAL  5.  Pt will be independent with self-scar tissue mobilization.  Baseline:  Goal status: INITIAL  6.  Pt will begin using dilators for  vaginal desensitization.  Baseline:  Goal status: INITIAL  LONG TERM GOALS: Target date: 03/29/24  Pt will be independent with advanced HEP.   Baseline:  Goal status: INITIAL  2.  Pt will demonstrate normal pelvic floor muscle tone and A/ROM, able to achieve 4/5 strength with contractions and 10 sec endurance, in order to provide appropriate lumbopelvic support in functional activities.   Baseline:  Goal status: INITIAL  3.  Pt will report 75% improvement in urinary incontinence.  Baseline:  Goal status: INITIAL  4.  Pt will be independent with dilator activities and progression.  Baseline:  Goal status: INITIAL  5.  Pt will report 50% improvement in discomfort with vaginal penetration, including gynecological exams.  Baseline:  Goal status: INITIAL  6.  Pt will demonstrate pelvic stability in Rt single leg stance to show improved core strength and balance.  Baseline:  Goal status: INITIAL  PLAN:  PT FREQUENCY: 1-2x/week  PT DURATION: 6 months  PLANNED INTERVENTIONS: 97110-Therapeutic exercises, 97530- Therapeutic activity, 97112- Neuromuscular re-education, 97535- Self Care, 11914- Manual therapy, Dry Needling, and Biofeedback  PLAN FOR NEXT SESSION: Educate on the knack, urge drill, and double voiding; perform scar tissue mobilization externally and internally; mobility activities. Begin to discuss dilator activities and how to begin using.   Verlena Glenn, PT, DPT04/16/254:10 PM

## 2023-11-18 DIAGNOSIS — J3081 Allergic rhinitis due to animal (cat) (dog) hair and dander: Secondary | ICD-10-CM | POA: Insufficient documentation

## 2023-11-19 ENCOUNTER — Ambulatory Visit (INDEPENDENT_AMBULATORY_CARE_PROVIDER_SITE_OTHER): Payer: 59 | Admitting: Neurology

## 2023-11-19 ENCOUNTER — Encounter: Payer: Self-pay | Admitting: Neurology

## 2023-11-19 VITALS — BP 104/76 | HR 81 | Ht 62.0 in | Wt 124.0 lb

## 2023-11-19 DIAGNOSIS — H5460 Unqualified visual loss, one eye, unspecified: Secondary | ICD-10-CM

## 2023-11-19 DIAGNOSIS — H539 Unspecified visual disturbance: Secondary | ICD-10-CM

## 2023-11-19 DIAGNOSIS — G43009 Migraine without aura, not intractable, without status migrainosus: Secondary | ICD-10-CM | POA: Diagnosis not present

## 2023-11-19 DIAGNOSIS — R200 Anesthesia of skin: Secondary | ICD-10-CM

## 2023-11-19 DIAGNOSIS — H5712 Ocular pain, left eye: Secondary | ICD-10-CM

## 2023-11-19 DIAGNOSIS — R519 Headache, unspecified: Secondary | ICD-10-CM

## 2023-11-19 MED ORDER — NURTEC 75 MG PO TBDP: 75.0000 mg | ORAL_TABLET | Freq: Every day | ORAL | 11 refills | Status: DC | PRN

## 2023-11-19 NOTE — Progress Notes (Signed)
 [facility]    Provider:  Dr Tresia Fruit Requesting Provider: Skotnicki, Meghan A, DO Primary Care Provider:  [facility]  CC:  migraines  HPI:  Susan West is a 53 y.o. female here as requested by Skotnicki, Meghan A, DO for migraines. has BRCA1 positive; Osteomyelitis (HCC); Mild intermittent asthma; Family history of osteoporosis; Prediabetes; Migraine without aura and without status migrainosus, not intractable; H/O: hysterectomy; and Allergic rhinitis due to animal hair and dander on their problem list.  She has had migraines since her 9s. Always came with her period, debilitating, mother has severe migraines as well which improved after menopause, she had a total hysterectomy in 2018, she had severe migraine 24-48 hours after her hysterectomy. Maxalt  helps. Worsening over the last year with more nausea, she discovered mol din her shower she had black stuff out of the faucett unclear if related with migraines/headache she got a filtration system and taking it out helped but the migraines persist. She gets sharp pains, stabs into the left eye without lacrimation or other autonmic symptoms the stabbing is along with a headache and she is numb on the left side and with a headache that is pulsating/pounding/throbbing with smell sensitivity more than light, hard to focus mentally and with glasses so vision changes and cloudy vision in both eyes and mainly left recently diagnosed with narrow angle glaucoma and she has to have surgery she doesn't see the specialist until May but still ongoing. She has total headaches about 14 days of the month and 6 moderate to severe migraine days a month and supplements with ibuprofen  but no medication overuse. Headaches are worsening, vision changes, making mistakes at work and sometimes problems talking, can be morning and positional. No other focal neurologic deficits, associated symptoms, inciting events or modifiable  factors.  Reviewed notes, labs and imaging from outside physicians, which showed:  Medications tried that can be used in migraine/headache management greater than 3 months include: Lifestyle modification, headache diaries, better sleep hygiene, exercise, management of migraine triggers, various nsaids/analgesics, flexeril, benadryl , gabapentin, naproxen, zofran , prednisone , maxalt , zoloft , had palpitations with sumatriptan that were significant so triptans are contraindicates.Topamax contraindicated due to narrow angle glaucoma. Blood pressure medications contraindicated due to hypotension (systolic 104)   CLINICAL DATA:  Sinus congestion.   EXAM: CT MAXILLOFACIAL WITHOUT CONTRAST   TECHNIQUE: Multidetector CT imaging of the maxillofacial structures was performed. Multiplanar CT image reconstructions were also generated.   RADIATION DOSE REDUCTION: This exam was performed according to the departmental dose-optimization program which includes automated exposure control, adjustment of the mA and/or kV according to patient size and/or use of iterative reconstruction technique.   COMPARISON:  Paranasal sinus CT November 01, 2011.   FINDINGS: Osseous: No fracture or mandibular dislocation. No destructive process.   Orbits: Negative. No traumatic or inflammatory finding.   Sinuses: Clear.   Soft tissues: Negative.   Limited intracranial: No significant or unexpected finding.   IMPRESSION: Clear sinuses.     Electronically Signed   By: Stevenson Elbe M.D.   On: 04/05/2023 14:26     Latest Ref Rng & Units 06/25/2022   11:40 AM 01/25/2021    2:11 PM 11/24/2020    1:19 PM  CMP  Glucose 70 - 99 mg/dL 93  99  295   BUN 6 - 24 mg/dL 11  8  12    Creatinine 0.57 - 1.00 mg/dL 6.21  3.08  6.57   Sodium 134 - 144 mmol/L 140  139  138  Potassium 3.5 - 5.2 mmol/L 4.1  3.7  4.3   Chloride 96 - 106 mmol/L 101  103  103   CO2 20 - 29 mmol/L 23  28  30    Calcium 8.7 - 10.2 mg/dL 9.5  8.9   9.5   Total Protein 6.0 - 8.5 g/dL 7.0   6.7   Total Bilirubin 0.0 - 1.2 mg/dL 0.3   0.5   Alkaline Phos 44 - 121 IU/L 75   52   AST 0 - 40 IU/L 16   15   ALT 0 - 32 IU/L 18   13       Latest Ref Rng & Units 07/21/2023    9:11 AM 06/25/2022   11:40 AM 02/06/2022    2:00 PM  CBC  WBC 3.4 - 10.8 x10E3/uL 4.0  3.8  4.4   Hemoglobin 11.1 - 15.9 g/dL 19.1  47.8  29.5   Hematocrit 34.0 - 46.6 % 38.9  41.2  42.6   Platelets 150 - 450 x10E3/uL 277  260  292      Review of Systems: Patient complains of symptoms per HPI as well as the following symptoms left eye acute closed angle glaucoma. Pertinent negatives and positives per HPI. All others negative.   Social History   Socioeconomic History   Marital status: Divorced    Spouse name: Not on file   Number of children: Not on file   Years of education: Not on file   Highest education level: Not on file  Occupational History   Not on file  Tobacco Use   Smoking status: Never   Smokeless tobacco: Never  Vaping Use   Vaping status: Never Used  Substance and Sexual Activity   Alcohol use: Yes    Alcohol/week: 0.0 - 1.0 standard drinks of alcohol   Drug use: Not Currently   Sexual activity: Yes    Partners: Male    Birth control/protection: Surgical  Other Topics Concern   Not on file  Social History Narrative   Not on file   Social Drivers of Health   Financial Resource Strain: Low Risk  (09/30/2023)   Received from [facility]   Overall Financial Resource Strain (CARDIA)    Difficulty of Paying Living Expenses: Not hard at all  Food Insecurity: No Food Insecurity (09/30/2023)   Received [facility]   Hunger Vital Sign    Worried About Running Out of Food in the Last Year: Never true    Ran Out of Food in the Last Year: Never true  Transportation Needs: No Transportation Needs (09/30/2023)   Received from [facility] - Transportation    In the past 12  months, has lack of transportation kept you from medical appointments or from getting medications?: No    Lack of Transportation (Non-Medical): No  Physical Activity: Not on file  Stress: Not on file  Social Connections: Not on file  Intimate Partner Violence: Not on file    Family History  Problem Relation Age of Onset   Cancer Father        prostate, bone   Hypertension Maternal Grandmother    Hypertension Paternal Grandfather    Heart attack Paternal Grandfather    Hypertension Paternal Grandmother    Hyperlipidemia Sister    Breast cancer Neg Hx     Past Medical History:  Diagnosis Date   Acne    adult-on doxycycline  daily   ADD (  attention deficit disorder)    Allergy    Asthma    EXERCISE INDUCED   BRCA1 positive 01/2017   Depression 2006   and anxiety   Environmental allergies    Headache, migraine    history of abnormal Pap smear        Osteopenia 2006   PONV (postoperative nausea and vomiting)    Seasonal allergies     Patient Active Problem List   Diagnosis Date Noted   Allergic rhinitis due to animal hair and dander 11/18/2023   Family history of osteoporosis 07/21/2023   Prediabetes 07/21/2023   Migraine without aura and without status migrainosus, not intractable 07/21/2023   H/O: hysterectomy 07/21/2023   Mild intermittent asthma 09/21/2020   Osteomyelitis (HCC) 04/08/2019   BRCA1 positive 03/21/2017    Past Surgical History:  Procedure Laterality Date   AUGMENTATION MAMMAPLASTY Bilateral 01/2001   BREAST ENHANCEMENT SURGERY  2002   bilateral implants   CESAREAN SECTION  06/2000   COSMETIC SURGERY     LAPAROSCOPIC HYSTERECTOMY  03/06/2018   wtih LSO   LAPAROTOMY  04/21/2012   Procedure: EXPLORATORY LAPAROTOMY;  Surgeon: Terry Ficks, MD PHD;  Location: [facility];  Service: Gynecology;  Laterality: N/A;   NASAL SINUS SURGERY     1999   SALPINGOOPHORECTOMY  04/21/2012   Procedure: SALPINGO OOPHERECTOMY;  Surgeon: Terry Ficks, MD PHD;   Location: [facility];  Service: Gynecology;  Laterality: Right;    Current Outpatient Medications  Medication Sig Dispense Refill   cetirizine (ZYRTEC) 10 MG tablet Take 10 mg by mouth daily.     MAGNESIUM  GLYCINATE PO Take by mouth.     Multiple Vitamins-Minerals (HAIR SKIN AND NAILS FORMULA PO) Take by mouth.     naproxen (NAPROSYN) 375 MG tablet Take 375 mg by mouth 2 (two) times daily as needed.     Nutritional Supplements (JUICE PLUS FIBRE PO) Take by mouth.     Rimegepant Sulfate (NURTEC) 75 MG TBDP Take 1 tablet (75 mg total) by mouth daily as needed. For migraines. Take as close to onset of migraine as possible. One daily maximum. 16 tablet 11   rizatriptan  (MAXALT ) 10 MG tablet TAKE 1 TABLET BY MOUTH AS NEEDED FOR MIGRAINE. MAY REPEAT IN 2 HOURS IF NEEDED. 10 tablet 12   triamcinolone  (NASACORT  ALLERGY 24HR) 55 MCG/ACT AERO nasal inhaler Place 2 sprays into the nose daily.     No current facility-administered medications for this visit.    Allergies as of 11/19/2023 - Review Complete 11/19/2023  Allergen Reaction Noted   Penicillins Nausea Only 04/03/2012   Azithromycin Nausea Only 03/05/2015   Loratadine  Other (See Comments) 09/30/2014   Tdap [tetanus-diphth-acell pertussis]  03/16/2013   Tramadol  Nausea Only 11/18/2023   Ultracet  [tramadol -acetaminophen ] Nausea And Vomiting 08/18/2014   Gold Rash 03/04/2018   Sumatriptan Palpitations 11/18/2023    Vitals: BP 104/76   Pulse 81   Ht 5\' 2"  (1.575 m)   Wt 124 lb (56.2 kg)   LMP 12/08/2017 (Within Days)   BMI 22.68 kg/m  Last Weight:  Wt Readings from Last 1 Encounters:  11/19/23 124 lb (56.2 kg)   Last Height:   Ht Readings from Last 1 Encounters:  11/19/23 5\' 2"  (1.575 m)     Physical exam: Exam: Gen: NAD, conversant, well nourised, well groomed                     CV: RRR, no MRG. No Carotid Bruits. No peripheral edema,  warm, nontender Eyes: Conjunctivae clear without exudates or hemorrhage  Neuro: Detailed  Neurologic Exam  Speech:    Speech is normal; fluent and spontaneous with normal comprehension.  Cognition:    The patient is oriented to person, place, and time;     recent and remote memory intact;     language fluent;     normal attention, concentration,     fund of knowledge Cranial Nerves:    The pupils are equal, round, and reactive to light. The fundi are normal and spontaneous venous pulsations are present. Visual fields are full to finger confrontation. Extraocular movements are intact. Trigeminal sensation is intact and the muscles of mastication are normal. The face is symmetric. The palate elevates in the midline. Hearing intact. Voice is normal. Shoulder shrug is normal. The tongue has normal motion without fasciculations.   Coordination: nml  Gait: nml  Motor Observation:    No asymmetry, no atrophy, and no involuntary movements noted. Tone:    Normal muscle tone.    Posture:    Posture is normal. normal erect    Strength:    Strength is V/V in the upper and lower limbs.      Sensation: intact to LT     Reflex Exam:  DTR's:    Deep tendon reflexes in the upper and lower extremities are normal bilaterally.   Toes:    The toes are downgoing bilaterally.   Clonus:    Clonus is absent.    Assessment/Plan:  Patient with migraines  MRI of the brain w/wo contrast: MRI brain due to concerning symptoms of morning headaches, positional headaches,vision changes, worsening headaches  to look for space occupying mass, chiari or intracranial hypertension (pseudotumor), strokes, malignancies, vasculidities, demyelination(multiple sclerosis) or other ESR and CRP just to rule out/evaluate for temporal Arteritis Doesn;t want to take a preventative until she gets eye treatment but will try Nurtec prn Follow up after eye treatment for angle closure and if needed can consider preventative such as cgrp medications  Orders Placed This Encounter  Procedures   MR BRAIN W WO  CONTRAST   Sedimentation rate   C-reactive protein   Meds ordered this encounter  Medications   Rimegepant Sulfate (NURTEC) 75 MG TBDP    Sig: Take 1 tablet (75 mg total) by mouth daily as needed. For migraines. Take as close to onset of migraine as possible. One daily maximum.    Dispense:  16 tablet    Refill:  11    Cc: Skotnicki, Meghan A, DO,  [employer]  Aldona Amel, MD  [facility] [address] [place], [place] [address]  Phone [phone] Fax [phone]

## 2023-11-19 NOTE — Patient Instructions (Addendum)
 MRI of the brain w/wo contrast ESR and CRP just to rule out/evaluate for temporal Arteritis Start Nurtec  Rimegepant Disintegrating Tablets What is this medication? RIMEGEPANT (ri ME je pant) prevents and treats migraines. It works by blocking a substance in the body that causes migraines. This medicine may be used for other purposes; ask your health care provider or pharmacist if you have questions. COMMON BRAND NAME(S): NURTEC ODT What should I tell my care team before I take this medication? They need to know if you have any of these conditions: Kidney disease Liver disease An unusual or allergic reaction to rimegepant, other medications, foods, dyes, or preservatives Pregnant or trying to get pregnant Breast-feeding How should I use this medication? Take this medication by mouth. Take it as directed on the prescription label. Leave the tablet in the sealed pack until you are ready to take it. With dry hands, open the pack and gently remove the tablet. If the tablet breaks or crumbles, throw it away. Use a new tablet. Place the tablet in the mouth and allow it to dissolve. Then, swallow it. Do not cut, crush, or chew this medication. You do not need water  to take this medication. Talk to your care team about the use of this medication in children. Special care may be needed. Overdosage: If you think you have taken too much of this medicine contact a poison control center or emergency room at once. NOTE: This medicine is only for you. Do not share this medicine with others. What if I miss a dose? This does not apply. This medication is not for regular use. What may interact with this medication? Certain medications for fungal infections, such as fluconazole , itraconazole Rifampin This list may not describe all possible interactions. Give your health care provider a list of all the medicines, herbs, non-prescription drugs, or dietary supplements you use. Also tell them if you smoke, drink  alcohol, or use illegal drugs. Some items may interact with your medicine. What should I watch for while using this medication? Visit your care team for regular checks on your progress. Tell your care team if your symptoms do not start to get better or if they get worse. What side effects may I notice from receiving this medication? Side effects that you should report to your care team as soon as possible: Allergic reactions--skin rash, itching, hives, swelling of the face, lips, tongue, or throat Side effects that usually do not require medical attention (report to your care team if they continue or are bothersome): Nausea Stomach pain This list may not describe all possible side effects. Call your doctor for medical advice about side effects. You may report side effects to FDA at 1-800-FDA-1088. Where should I keep my medication? Keep out of the reach of children and pets. Store at room temperature between 20 and 25 degrees C (68 and 77 degrees F). Get rid of any unused medication after the expiration date. To get rid of medications that are no longer needed or have expired: Take the medication to a medication take-back program. Check with your pharmacy or law enforcement to find a location. If you cannot return the medication, check the label or package insert to see if the medication should be thrown out in the garbage or flushed down the toilet. If you are not sure, ask your care team. If it is safe to put it in the trash, take the medication out of the container. Mix the medication with cat litter, dirt, coffee grounds,  or other unwanted substance. Seal the mixture in a bag or container. Put it in the trash. NOTE: This sheet is a summary. It may not cover all possible information. If you have questions about this medicine, talk to your doctor, pharmacist, or health care provider.  2024 Elsevier/Gold Standard (2021-08-29 00:00:00)

## 2023-11-20 ENCOUNTER — Encounter: Payer: Self-pay | Admitting: Neurology

## 2023-11-20 LAB — C-REACTIVE PROTEIN: CRP: <1 mg/L (ref 0–10)

## 2023-11-20 LAB — SEDIMENTATION RATE: Sed Rate: 4 mm/h (ref 0–40)

## 2023-11-24 ENCOUNTER — Telehealth: Payer: Self-pay | Admitting: Neurology

## 2023-11-24 NOTE — Telephone Encounter (Signed)
 MR brain w/wo scheduled at [facility] for 11/26/23 @ 7:30 am.  [facility] auth: [id number] (11/20/23-01/04/24)

## 2023-11-24 NOTE — Telephone Encounter (Signed)
 Patient called to schedule MRI. Transferred called to Jillian to schedule MRI

## 2023-11-24 NOTE — Telephone Encounter (Signed)
 Pt called requesting information on what time her MRI appointment was going to be. Pt was given MRI appt date and time, 11/26/2023 at 7:30 am.

## 2023-11-26 ENCOUNTER — Other Ambulatory Visit (HOSPITAL_COMMUNITY): Payer: Self-pay

## 2023-11-26 ENCOUNTER — Ambulatory Visit

## 2023-11-26 ENCOUNTER — Encounter: Payer: Self-pay | Admitting: Neurology

## 2023-11-26 ENCOUNTER — Telehealth: Payer: Self-pay

## 2023-11-26 DIAGNOSIS — H539 Unspecified visual disturbance: Secondary | ICD-10-CM

## 2023-11-26 DIAGNOSIS — H5712 Ocular pain, left eye: Secondary | ICD-10-CM

## 2023-11-26 DIAGNOSIS — R519 Headache, unspecified: Secondary | ICD-10-CM

## 2023-11-26 DIAGNOSIS — R200 Anesthesia of skin: Secondary | ICD-10-CM | POA: Diagnosis not present

## 2023-11-26 DIAGNOSIS — H5460 Unqualified visual loss, one eye, unspecified: Secondary | ICD-10-CM

## 2023-11-26 MED ORDER — GADOBENATE DIMEGLUMINE 529 MG/ML IV SOLN
10.0000 mL | Freq: Once | INTRAVENOUS | Status: AC | PRN
  Administered 2023-11-26: 10 mL via INTRAVENOUS

## 2023-11-26 NOTE — Telephone Encounter (Signed)
 Pharmacy Patient Advocate Encounter   Received notification from Fax that prior authorization for Nurtec 75MG  dispersible tablets is required/requested.   Insurance verification completed.   The patient is insured through [facility] .   Per test claim: PA required; PA submitted to above mentioned insurance via CoverMyMeds Key/confirmation #/EOC [id number] Status is pending

## 2023-11-26 NOTE — Telephone Encounter (Signed)
 Pharmacy Patient Advocate Encounter  Received notification from [facility] that Prior Authorization for Nurtec 75MG  dispersible tablets has been APPROVED from 11/26/2023 to 11/25/2024. Ran test claim, Copay is $0. This test claim was processed through [facility]- copay amounts may vary at other pharmacies due to pharmacy/plan contracts, or as the patient moves through the different stages of their insurance plan.   PA #/Case ID/Reference #: PA Case ID #: [id number]

## 2023-11-27 ENCOUNTER — Ambulatory Visit

## 2023-11-27 ENCOUNTER — Telehealth: Payer: Self-pay | Admitting: *Deleted

## 2023-11-27 NOTE — Telephone Encounter (Signed)
 Received fax from [facility] with office visits from 11/12/23 and 11/21/23. Reports placed on Dr Harding Li desk for review.

## 2023-12-01 ENCOUNTER — Telehealth: Payer: Self-pay | Admitting: Neurology

## 2023-12-01 NOTE — Telephone Encounter (Signed)
 I don;t see where I ordered a CT at all? I ordered an MRI brain. Do you see a CTA Head ordered by me?

## 2023-12-01 NOTE — Telephone Encounter (Signed)
 Susan West put this information in the wrong patient's chart. I sent you a message with the correct patient. We can ignore this message, thanks!

## 2023-12-01 NOTE — Telephone Encounter (Signed)
 Marlena from [employer] called with questions regarding CTA Head scheduled for 2:30 pm tomorrow. She wanted to check if auth had been completed yet.   She also was questioning if the order was supposed to be for both the head and neck or just the head. She said if you wanted to add the neck she could change it on her end, she would just need MD's approval. If order ends up being changed, it would need an auth as well.  Her call back # is [phone].

## 2023-12-04 ENCOUNTER — Encounter

## 2023-12-10 ENCOUNTER — Ambulatory Visit

## 2023-12-11 ENCOUNTER — Encounter

## 2023-12-24 ENCOUNTER — Ambulatory Visit

## 2024-04-19 ENCOUNTER — Encounter: Payer: Self-pay | Admitting: Dermatology

## 2024-05-12 ENCOUNTER — Encounter: Payer: Self-pay | Admitting: Dermatology

## 2024-05-12 ENCOUNTER — Ambulatory Visit: Payer: 59 | Admitting: Dermatology

## 2024-05-12 DIAGNOSIS — L578 Other skin changes due to chronic exposure to nonionizing radiation: Secondary | ICD-10-CM

## 2024-05-12 DIAGNOSIS — L814 Other melanin hyperpigmentation: Secondary | ICD-10-CM

## 2024-05-12 DIAGNOSIS — L821 Other seborrheic keratosis: Secondary | ICD-10-CM

## 2024-05-12 DIAGNOSIS — L820 Inflamed seborrheic keratosis: Secondary | ICD-10-CM | POA: Diagnosis not present

## 2024-05-12 DIAGNOSIS — W908XXA Exposure to other nonionizing radiation, initial encounter: Secondary | ICD-10-CM

## 2024-05-12 DIAGNOSIS — D2272 Melanocytic nevi of left lower limb, including hip: Secondary | ICD-10-CM

## 2024-05-12 DIAGNOSIS — L738 Other specified follicular disorders: Secondary | ICD-10-CM

## 2024-05-12 DIAGNOSIS — Z1283 Encounter for screening for malignant neoplasm of skin: Secondary | ICD-10-CM

## 2024-05-12 DIAGNOSIS — D229 Melanocytic nevi, unspecified: Secondary | ICD-10-CM

## 2024-05-12 NOTE — Progress Notes (Addendum)
 Follow-Up Visit   Subjective  Susan West is a 53 y.o. female who presents for the following: Skin Cancer Screening and Full Body Skin Exam. No personal Hx of skin cancer or dysplastic nevi.   The patient presents for Total-Body Skin Exam (TBSE) for skin cancer screening and mole check. The patient has spots, moles and lesions to be evaluated, some may be new or changing and the patient may have concern these could be cancer.  The following portions of the chart were reviewed this encounter and updated as appropriate: medications, allergies, medical history  Review of Systems:  No other skin or systemic complaints except as noted in HPI or Assessment and Plan.  Objective  Well appearing patient in no apparent distress; mood and affect are within normal limits.  A full examination was performed including scalp, head, eyes, ears, nose, lips, neck, chest, axillae, abdomen, back, buttocks, bilateral upper extremities, bilateral lower extremities, hands, feet, fingers, toes, fingernails, and toenails. All findings within normal limits unless otherwise noted below.   Relevant physical exam findings are noted in the Assessment and Plan.      Exam of nails limited by presence of nail polish.  Left Thigh - Anterior x1, R thigh anterior x2, L medial lower leg x1 (4) Erythematous keratotic or waxy stuck-on papule or plaque.  Assessment & Plan   SKIN CANCER SCREENING PERFORMED TODAY.  ACTINIC DAMAGE - Chronic condition, secondary to cumulative UV/sun exposure - diffuse scaly erythematous macules with underlying dyspigmentation - Recommend daily broad spectrum sunscreen SPF 30+ to sun-exposed areas, reapply every 2 hours as needed.  - Staying in the shade or wearing long sleeves, sun glasses (UVA+UVB protection) and wide brim hats (4-inch brim around the entire circumference of the hat) are also recommended for sun protection.  - Call for new or changing lesions.  LENTIGINES,  SEBORRHEIC KERATOSES, HEMANGIOMAS - Benign normal skin lesions - Benign-appearing - Call for any changes  MELANOCYTIC NEVI - Tan-brown and/or pink-flesh-colored symmetric macules and papules - 2 mm brown macule at L plantar foot. - Benign appearing on exam today - Observation - Call clinic for new or changing moles - Recommend daily use of broad spectrum spf 30+ sunscreen to sun-exposed areas.  - Check nails when remove polish.   Sebaceous Hyperplasia - Small yellow papules with a central dell at face - Benign-appearing - Observe. Call for changes.   INFLAMED SEBORRHEIC KERATOSIS (4) Left Thigh - Anterior x1, R thigh anterior x2, L medial lower leg x1 (4) Symptomatic, irritating, patient would like treated. Destruction of lesion - Left Thigh - Anterior x1, R thigh anterior x2, L medial lower leg x1 (4) Complexity: simple   Destruction method: cryotherapy   Informed consent: discussed and consent obtained   Timeout:  patient name, date of birth, surgical site, and procedure verified Lesion destroyed using liquid nitrogen: Yes   Region frozen until ice ball extended beyond lesion: Yes   Outcome: patient tolerated procedure well with no complications   Post-procedure details: wound care instructions given   Additional details:  Prior to procedure, discussed risks of blister formation, small wound, skin dyspigmentation, or rare scar following cryotherapy. Recommend Vaseline ointment to treated areas while healing.   ACTINIC SKIN DAMAGE   SKIN CANCER SCREENING   LENTIGO   MELANOCYTIC NEVUS, UNSPECIFIED LOCATION   SEBORRHEIC KERATOSIS   SEBACEOUS HYPERPLASIA OF FACE   Return in about 1 year (around 05/12/2025) for TBSE.  I, Jill Parcell, CMA, am acting as scribe  for Alm Rhyme, MD.   Documentation: I have reviewed the above documentation for accuracy and completeness, and I agree with the above.  Alm Rhyme, MD

## 2024-05-12 NOTE — Patient Instructions (Addendum)

## 2024-06-24 ENCOUNTER — Telehealth: Admitting: Adult Health

## 2024-06-24 DIAGNOSIS — G43009 Migraine without aura, not intractable, without status migrainosus: Secondary | ICD-10-CM

## 2024-06-24 MED ORDER — RIZATRIPTAN BENZOATE 10 MG PO TABS: ORAL_TABLET | ORAL | 12 refills | Status: AC

## 2024-06-24 MED ORDER — NURTEC 75 MG PO TBDP: 75.0000 mg | ORAL_TABLET | Freq: Every day | ORAL | 11 refills | Status: AC | PRN

## 2024-06-24 NOTE — Progress Notes (Signed)
 PATIENT: Susan West DOB: 10-07-1970  REASON FOR VISIT: follow up HISTORY FROM: patient  Virtual Visit via Video Note  I connected with Susan West on 06/24/24 at  1:00 PM EST by a video enabled telemedicine application located remotely at [facility] and verified that I am speaking with the correct person using two identifiers who was located at their work in [place].   I discussed the limitations of evaluation and management by telemedicine and the availability of in person appointments. The patient expressed understanding and agreed to proceed.   PATIENT: Susan West DOB: Apr 22, 1971  REASON FOR VISIT: follow up HISTORY FROM: patient  HISTORY OF PRESENT ILLNESS: Today 06/24/24  Susan West is a 53 y.o. female with a history of Migraine headaches. Returns today for follow-up.  She reports that overall she is doing well.  She had eye surgery in September and her headaches have improved since then.  She states that she rarely has a headache now.  If she does get a headache its typically around the temporal region.  Sometimes can be triggered by stress.  Nurtec works well.  If Nurtec does not work she will use the Maxalt .  Returns today for an evaluation.    HISTORY  Susan West is a 53 y.o. female here as requested by Skotnicki, Meghan A, DO for migraines. has BRCA1 positive; Osteomyelitis (HCC); Mild intermittent asthma; Family history of osteoporosis; Prediabetes; Migraine without aura and without status migrainosus, not intractable; H/O: hysterectomy; and Allergic rhinitis due to animal hair and dander on their problem list.   She has had migraines since her 41s. Always came with her period, debilitating, mother has severe migraines as well which improved after menopause, she had a total hysterectomy in 2018, she had severe migraine 24-48 hours after her hysterectomy. Maxalt  helps. Worsening over the last year with more nausea, she  discovered mol din her shower she had black stuff out of the faucett unclear if related with migraines/headache she got a filtration system and taking it out helped but the migraines persist. She gets sharp pains, stabs into the left eye without lacrimation or other autonmic symptoms the stabbing is along with a headache and she is numb on the left side and with a headache that is pulsating/pounding/throbbing with smell sensitivity more than light, hard to focus mentally and with glasses so vision changes and cloudy vision in both eyes and mainly left recently diagnosed with narrow angle glaucoma and she has to have surgery she doesn't see the specialist until May but still ongoing. She has total headaches about 14 days of the month and 6 moderate to severe migraine days a month and supplements with ibuprofen  but no medication overuse. Headaches are worsening, vision changes, making mistakes at work and sometimes problems talking, can be morning and positional. No other focal neurologic deficits, associated symptoms, inciting events or modifiable factors.   Reviewed notes, labs and imaging from outside physicians, which showed:   Medications tried that can be used in migraine/headache management greater than 3 months include: Lifestyle modification, headache diaries, better sleep hygiene, exercise, management of migraine triggers, various nsaids/analgesics, flexeril, benadryl , gabapentin, naproxen, zofran , prednisone , maxalt , zoloft , had palpitations with sumatriptan that were significant so triptans are contraindicates.Topamax contraindicated due to narrow angle glaucoma. Blood pressure medications contraindicated due to hypotension (systolic 104)    REVIEW OF SYSTEMS: Out of a complete 14 system review of symptoms, the patient complains only of the following symptoms,  and all other reviewed systems are negative.  ALLERGIES: Allergies  Allergen Reactions   Penicillins Nausea Only    Extreme stomach  pain Extreme stomach pain   Azithromycin Nausea Only    Other Reactions Per Pt: Back Pain, Menstrual Spotting, Abdominal Cramping   Loratadine  Other (See Comments)    migraines migraines   Tdap [Tetanus-Diphth-Acell Pertussis]     Significant swelling x one month   Tramadol  Nausea Only   Ultracet  [Tramadol -Acetaminophen ] Nausea And Vomiting   Gold Rash   Sumatriptan Palpitations    HOME MEDICATIONS: Outpatient Medications Prior to Visit  Medication Sig Dispense Refill   cetirizine (ZYRTEC) 10 MG tablet Take 10 mg by mouth daily.     MAGNESIUM  GLYCINATE PO Take by mouth.     Multiple Vitamins-Minerals (HAIR SKIN AND NAILS FORMULA PO) Take by mouth.     naproxen (NAPROSYN) 375 MG tablet Take 375 mg by mouth 2 (two) times daily as needed.     Nutritional Supplements (JUICE PLUS FIBRE PO) Take by mouth.     triamcinolone  (NASACORT  ALLERGY 24HR) 55 MCG/ACT AERO nasal inhaler Place 2 sprays into the nose daily.     Rimegepant Sulfate (NURTEC) 75 MG TBDP Take 1 tablet (75 mg total) by mouth daily as needed. For migraines. Take as close to onset of migraine as possible. One daily maximum. 16 tablet 11   rizatriptan  (MAXALT ) 10 MG tablet TAKE 1 TABLET BY MOUTH AS NEEDED FOR MIGRAINE. MAY REPEAT IN 2 HOURS IF NEEDED. 10 tablet 12   No facility-administered medications prior to visit.    PAST MEDICAL HISTORY: Past Medical History:  Diagnosis Date   Acne    adult-on doxycycline  daily   ADD (attention deficit disorder)    Allergy    Asthma    EXERCISE INDUCED   BRCA1 positive 01/2017   Depression 2006   and anxiety   Environmental allergies    Headache, migraine    history of abnormal Pap smear        Osteopenia 2006   PONV (postoperative nausea and vomiting)    Seasonal allergies     PAST SURGICAL HISTORY: Past Surgical History:  Procedure Laterality Date   AUGMENTATION MAMMAPLASTY Bilateral 01/2001   BREAST ENHANCEMENT SURGERY  2002   bilateral implants   CESAREAN SECTION   06/2000   COSMETIC SURGERY     LAPAROSCOPIC HYSTERECTOMY  03/06/2018   wtih LSO   LAPAROTOMY  04/21/2012   Procedure: EXPLORATORY LAPAROTOMY;  Surgeon: Sari Bachelor, MD PHD;  Location: [facility];  Service: Gynecology;  Laterality: N/A;   NASAL SINUS SURGERY     1999   SALPINGOOPHORECTOMY  04/21/2012   Procedure: SALPINGO OOPHERECTOMY;  Surgeon: Sari Bachelor, MD PHD;  Location: [facility];  Service: Gynecology;  Laterality: Right;    FAMILY HISTORY: Family History  Problem Relation Age of Onset   Cancer Father        prostate, bone   Hypertension Maternal Grandmother    Hypertension Paternal Grandfather    Heart attack Paternal Grandfather    Hypertension Paternal Grandmother    Hyperlipidemia Sister    Breast cancer Neg Hx     SOCIAL HISTORY: Social History   Socioeconomic History   Marital status: Divorced    Spouse name: Not on file   Number of children: Not on file   Years of education: Not on file   Highest education level: Not on file  Occupational History   Not on file  Tobacco Use  Smoking status: Never   Smokeless tobacco: Never  Vaping Use   Vaping status: Never Used  Substance and Sexual Activity   Alcohol use: Yes    Alcohol/week: 0.0 - 1.0 standard drinks of alcohol   Drug use: Not Currently   Sexual activity: Yes    Partners: Male    Birth control/protection: Surgical  Other Topics Concern   Not on file  Social History Narrative   Not on file   Social Drivers of Health   Financial Resource Strain: Low Risk  (09/30/2023)   Received from [facility]   Overall Financial Resource Strain (CARDIA)    Difficulty of Paying Living Expenses: Not hard at all  Food Insecurity: No Food Insecurity (09/30/2023)   Received from [facility]   Hunger Vital Sign    Within the past 12 months, you worried that your food would run out before you got the money to buy more.: Never true    Within the past 12 months, the food you bought  just didn't last and you didn't have money to get more.: Never true  Transportation Needs: No Transportation Needs (09/30/2023)   Received from [facility] - Transportation    In the past 12 months, has lack of transportation kept you from medical appointments or from getting medications?: No    Lack of Transportation (Non-Medical): No  Physical Activity: Not on file  Stress: Not on file  Social Connections: Not on file  Intimate Partner Violence: Not on file      PHYSICAL EXAM Generalized: Well developed, in no acute distress   Neurological examination  Mentation: Alert oriented to time, place, history taking. Follows all commands speech and language fluent Cranial nerve II-XII: Facial symmetry noted.   DIAGNOSTIC DATA (LABS, IMAGING, TESTING) - I reviewed patient records, labs, notes, testing and imaging myself where available.  Lab Results  Component Value Date   WBC 4.0 07/21/2023   HGB 13.0 07/21/2023   HCT 38.9 07/21/2023   MCV 94 07/21/2023   PLT 277 07/21/2023      Component Value Date/Time   NA 140 06/25/2022 1140   K 4.1 06/25/2022 1140   CL 101 06/25/2022 1140   CO2 23 06/25/2022 1140   GLUCOSE 93 06/25/2022 1140   GLUCOSE 99 01/25/2021 1411   BUN 11 06/25/2022 1140   CREATININE 0.83 06/25/2022 1140   CREATININE 0.77 11/24/2020 1319   CREATININE 0.73 12/07/2015 1048   CALCIUM 9.5 06/25/2022 1140   PROT 7.0 06/25/2022 1140   ALBUMIN 4.6 06/25/2022 1140   AST 16 06/25/2022 1140   AST 15 11/24/2020 1319   ALT 18 06/25/2022 1140   ALT 13 11/24/2020 1319   ALKPHOS 75 06/25/2022 1140   BILITOT 0.3 06/25/2022 1140   BILITOT 0.5 11/24/2020 1319   GFRNONAA >60 01/25/2021 1411   GFRNONAA >60 11/24/2020 1319   GFRAA 106 05/25/2020 1006   Lab Results  Component Value Date   CHOL 246 (H) 06/25/2022   HDL 79 06/25/2022   LDLCALC 155 (H) 06/25/2022   TRIG 72 06/25/2022   CHOLHDL 3.1 06/25/2022   Lab Results  Component Value  Date   HGBA1C 6.0 (H) 07/21/2023   Lab Results  Component Value Date   VITAMINB12 360 12/07/2015   Lab Results  Component Value Date   TSH 2.050 06/25/2022      ASSESSMENT AND PLAN 53 y.o. year old female  has a past medical history of  Acne, ADD (attention deficit disorder), Allergy, Asthma, BRCA1 positive (01/2017), Depression (2006), Environmental allergies, Headache, migraine, history of abnormal Pap smear, Osteopenia (2006), PONV (postoperative nausea and vomiting), and Seasonal allergies. here with:    Migraine headache without aura  Continue Nurtec for abortive therapy  Can use Maxalt  for abortive therapy  If your symptoms worsen or you develop new symptoms please let us  know.  FU in 1 year or sooner if needed   * No order type specified * Meds ordered this encounter  Medications   Rimegepant Sulfate (NURTEC) 75 MG TBDP    Sig: Take 1 tablet (75 mg total) by mouth daily as needed. For migraines. Take as close to onset of migraine as possible. One daily maximum.    Dispense:  16 tablet    Refill:  11    Supervising Provider:   YAN, YIJUN [[id number]]   rizatriptan  (MAXALT ) 10 MG tablet    Sig: TAKE 1 TABLET BY MOUTH AS NEEDED FOR MIGRAINE. MAY REPEAT IN 2 HOURS IF NEEDED. Max 2 tabs in 24 hours    Dispense:  10 tablet    Refill:  12    Supervising Provider:   YAN, YIJUN [[id number]]     Duwaine Russell, MSN, NP-C 06/24/2024, 1:12 PM [facility] [address], [address] [place], [place] [address] [phone]  The patient's condition requires frequent monitoring and adjustments in the treatment plan, reflecting the ongoing complexity of care.  This provider is the continuing focal point for all needed services for this condition.

## 2024-06-24 NOTE — Patient Instructions (Signed)
 Your Plan:  Continue Nurtec or maxalt  for abortive therapy If your symptoms worsen or you develop new symptoms please let us  know.   Thank you for coming to see us  at [facility]. I hope we have been able to provide you high quality care today.  You may receive a patient satisfaction survey over the next few weeks. We would appreciate your feedback and comments so that we may continue to improve ourselves and the health of our patients.

## 2024-07-10 ENCOUNTER — Ambulatory Visit
Admission: EM | Admit: 2024-07-10 | Discharge: 2024-07-10 | Disposition: A | Discharge status: Home / Self Care | Attending: Emergency Medicine | Admitting: Emergency Medicine

## 2024-07-10 ENCOUNTER — Encounter: Payer: Self-pay | Admitting: Emergency Medicine

## 2024-07-10 DIAGNOSIS — J019 Acute sinusitis, unspecified: Secondary | ICD-10-CM

## 2024-07-10 DIAGNOSIS — H9201 Otalgia, right ear: Secondary | ICD-10-CM | POA: Diagnosis not present

## 2024-07-10 MED ORDER — PREDNISONE 10 MG (21) PO TBPK: ORAL_TABLET | Freq: Every day | ORAL | 0 refills | Status: AC

## 2024-07-10 MED ORDER — MECLIZINE HCL 12.5 MG PO TABS: 12.5000 mg | ORAL_TABLET | Freq: Three times a day (TID) | ORAL | 0 refills | Status: AC | PRN

## 2024-07-10 MED ORDER — DOXYCYCLINE HYCLATE 100 MG PO CAPS: 100.0000 mg | ORAL_CAPSULE | Freq: Two times a day (BID) | ORAL | 0 refills | Status: AC

## 2024-07-10 NOTE — ED Triage Notes (Signed)
 Patient reports right ear pain that started today. Patient also reports nasal congestion 2.5 weeks. Patient also complains of vertigo  x 9 days. Patient has been Zyrtec and nasal spray for symptoms. Rates ear pain 4/10.

## 2024-07-10 NOTE — Discharge Instructions (Signed)
 Based on the description of your symptoms I do believe your symptoms are related to congestion in sinuses, on exam there is no abnormality to the ear indicating an ear infection  Begin doxycycline  twice daily for 10 days to get rid of any bacteria contributing to your symptoms  Begin prednisone  every morning with food as directed to reduce inflammation throughout the sinuses which should also help with the ear pain, avoid ibuprofen  during use but may use Tylenol   For dizziness you may use meclizine  every 8 hours as needed  While dizzy change positions slowly waiting at least 10 seconds before movement and if you are dizzy you yourself to a seated position to prevent injury   For congestion: take a daily anti-histamine like Zyrtec, Claritin , and a oral decongestant, such as pseudoephedrine.  You can also use Flonase  1-2 sprays in each nostril daily.   It is important to stay hydrated: drink plenty of fluids (water , gatorade/powerade/pedialyte, juices, or teas) to keep your throat moisturized and help further relieve irritation/discomfort.

## 2024-07-10 NOTE — ED Provider Notes (Signed)
 " [facility]    CSN: [id number] Arrival date & time: 07/10/24  1100      History   Chief Complaint Chief Complaint  Patient presents with   Otalgia   Dizziness   Nasal Congestion    HPI Susan West is a 53 y.o. female.   Patient presents for evaluation of nasal congestion present for 2-1/2 weeks.  Has been experiencing dizziness described as the room spinning and feeling off balance present for 9 days.  Began to experience right-sided ear pain this morning.  Associated infrequent nonproductive cough.  Dizziness started abruptly upon awakening.  Has had several times of falling backwards onto her bed after standing up but denies hitting head or loss of consciousness.  Dizziness is exacerbated by movements such as turning of the body in hand.  Exacerbated by bending over.  Has attempted use of Zyrtec and nasal spray with minimal relief.  Endorses allergies requiring injection for treatment.  Denies sinus pain or pressure, fever.    Past Medical History:  Diagnosis Date   Acne    adult-on doxycycline  daily   ADD (attention deficit disorder)    Allergy    Asthma    EXERCISE INDUCED   BRCA1 positive 01/2017   Depression 2006   and anxiety   Environmental allergies    Headache, migraine    history of abnormal Pap smear        Osteopenia 2006   PONV (postoperative nausea and vomiting)    Seasonal allergies     Patient Active Problem List   Diagnosis Date Noted   Allergic rhinitis due to animal hair and dander 11/18/2023   Family history of osteoporosis 07/21/2023   Prediabetes 07/21/2023   Migraine without aura and without status migrainosus, not intractable 07/21/2023   H/O: hysterectomy 07/21/2023   Mild intermittent asthma 09/21/2020   Osteomyelitis (HCC) 04/08/2019   BRCA1 positive 03/21/2017    Past Surgical History:  Procedure Laterality Date   AUGMENTATION MAMMAPLASTY Bilateral 01/2001   BREAST ENHANCEMENT SURGERY  2002   bilateral implants    CESAREAN SECTION  06/2000   COSMETIC SURGERY     LAPAROSCOPIC HYSTERECTOMY  03/06/2018   wtih LSO   LAPAROTOMY  04/21/2012   Procedure: EXPLORATORY LAPAROTOMY;  Surgeon: Sari Bachelor, MD PHD;  Location: [facility];  Service: Gynecology;  Laterality: N/A;   NASAL SINUS SURGERY     1999   SALPINGOOPHORECTOMY  04/21/2012   Procedure: SALPINGO OOPHERECTOMY;  Surgeon: Sari Bachelor, MD PHD;  Location: [facility];  Service: Gynecology;  Laterality: Right;    OB History     Gravida  3   Para  2   Term      Preterm      AB  1   Living  2      SAB      IAB  1   Ectopic      Multiple      Live Births               Home Medications    Prior to Admission medications  Medication Sig Start Date End Date Taking? Authorizing Provider  cetirizine (ZYRTEC) 10 MG tablet Take 10 mg by mouth daily.    [provider]  MAGNESIUM  GLYCINATE PO Take by mouth.    [provider]  Multiple Vitamins-Minerals (HAIR SKIN AND NAILS FORMULA PO) Take by mouth.    [provider]  naproxen (NAPROSYN) 375 MG tablet Take 375 mg  by mouth 2 (two) times daily as needed. 09/03/23   [provider]  Nutritional Supplements (JUICE PLUS FIBRE PO) Take by mouth.    [provider]  Rimegepant Sulfate (NURTEC) 75 MG TBDP Take 1 tablet (75 mg total) by mouth daily as needed. For migraines. Take as close to onset of migraine as possible. One daily maximum. 06/24/24   Millikan, Megan, NP  rizatriptan  (MAXALT ) 10 MG tablet TAKE 1 TABLET BY MOUTH AS NEEDED FOR MIGRAINE. MAY REPEAT IN 2 HOURS IF NEEDED. Max 2 tabs in 24 hours 06/24/24   Millikan, Megan, NP  triamcinolone  (NASACORT  ALLERGY 24HR) 55 MCG/ACT AERO nasal inhaler Place 2 sprays into the nose daily.    [provider]  azelastine (ASTELIN) 0.1 % nasal spray Place into both nostrils 2 (two) times daily. Use in each nostril as directed  02/23/19  [provider]  levonorgestrel  (MIRENA ) 20 MCG/24HR  IUD 1 each by Intrauterine route once.  02/23/19  [provider]    Family History Family History  Problem Relation Age of Onset   Cancer Father        prostate, bone   Hypertension Maternal Grandmother    Hypertension Paternal Grandfather    Heart attack Paternal Grandfather    Hypertension Paternal Grandmother    Hyperlipidemia Sister    Breast cancer Neg Hx     Social History Social History[1]   Allergies   Penicillins, Azithromycin, Loratadine , Tdap [tetanus-diphth-acell pertussis], Tramadol , Ultracet  [tramadol -acetaminophen ], Gold, and Sumatriptan   Review of Systems Review of Systems  Constitutional: Negative.   HENT:  Positive for congestion and ear pain. Negative for dental problem, drooling, ear discharge, facial swelling, hearing loss, mouth sores, nosebleeds, postnasal drip, rhinorrhea, sinus pressure, sinus pain, sneezing, sore throat, tinnitus, trouble swallowing and voice change.   Respiratory:  Positive for cough. Negative for apnea, choking, chest tightness, shortness of breath, wheezing and stridor.   Cardiovascular: Negative.   Gastrointestinal: Negative.   Neurological:  Positive for dizziness. Negative for tremors, seizures, syncope, facial asymmetry, speech difficulty, weakness, light-headedness, numbness and headaches.     Physical Exam Triage Vital Signs ED Triage Vitals  Encounter Vitals Group     BP 07/10/24 1126 107/72     Girls Systolic BP Percentile --      Girls Diastolic BP Percentile --      Boys Systolic BP Percentile --      Boys Diastolic BP Percentile --      Pulse Rate 07/10/24 1126 88     Resp 07/10/24 1126 19     Temp 07/10/24 1126 98.1 F (36.7 C)     Temp Source 07/10/24 1126 Oral     SpO2 07/10/24 1126 97 %     Weight --      Height --      Head Circumference --      Peak Flow --      Pain Score 07/10/24 1129 4     Pain Loc --      Pain Education --      Exclude from Growth Chart --    No data  found.  Updated Vital Signs BP 107/72 (BP Location: Left Arm)   Pulse 88   Temp 98.1 F (36.7 C) (Oral)   Resp 19   LMP 12/08/2017   SpO2 97%   Visual Acuity Right Eye Distance:   Left Eye Distance:   Bilateral Distance:    Right Eye Near:   Left Eye Near:  Bilateral Near:     Physical Exam Constitutional:      Appearance: Normal appearance.  HENT:     Head: Normocephalic.     Right Ear: Tympanic membrane, ear canal and external ear normal.     Left Ear: Tympanic membrane, ear canal and external ear normal.     Nose: Congestion present.     Right Sinus: No maxillary sinus tenderness or frontal sinus tenderness.     Left Sinus: No maxillary sinus tenderness or frontal sinus tenderness.     Mouth/Throat:     Pharynx: No oropharyngeal exudate or posterior oropharyngeal erythema.  Eyes:     Extraocular Movements: Extraocular movements intact.  Pulmonary:     Effort: Pulmonary effort is normal.  Neurological:     General: No focal deficit present.     Mental Status: She is alert and oriented to person, place, and time. Mental status is at baseline.      UC Treatments / Results  Labs (all labs ordered are listed, but only abnormal results are displayed) Labs Reviewed - No data to display  EKG   Radiology No results found.  Procedures Procedures (including critical care time)  Medications Ordered in UC Medications - No data to display  Initial Impression / Assessment and Plan / UC Course  I have reviewed the triage vital signs and the nursing notes.  Pertinent labs & imaging results that were available during my care of the patient were reviewed by me and considered in my medical decision making (see chart for details).  Acute sinusitis,  otalgia right ear  No abnormality to the ear indicating infection, symptomatic presentation concerning for sinusitis causing dizziness, discussed with patient, no neurological deficits on exam, stable for outpatient  management, prescribed doxycycline  as well as prednisone  and meclizine  and discussed administration, reviewed safety precautions for dizziness such as easing self to the floor and changing positions slowly, recommended continued medications for management of congestion and advise follow-up if symptoms continue to persist for any worsening symptoms given strict ER precautions Final Clinical Impressions(s) / UC Diagnoses   Final diagnoses:  None   Discharge Instructions   None    ED Prescriptions   None    PDMP not reviewed this encounter.     [1]  Social History Tobacco Use   Smoking status: Never   Smokeless tobacco: Never  Vaping Use   Vaping status: Never Used  Substance Use Topics   Alcohol use: Yes    Alcohol/week: 0.0 - 1.0 standard drinks of alcohol   Drug use: Not Currently     Teresa Shelba SAUNDERS, NP 07/10/24 1234  "

## 2025-05-18 ENCOUNTER — Ambulatory Visit: Admitting: Dermatology

## 2025-06-24 ENCOUNTER — Telehealth: Admitting: Adult Health
